# Patient Record
Sex: Male | Born: 1935 | Race: White | Hispanic: No | Marital: Married | Smoking: Former smoker
Health system: Southern US, Community
[De-identification: ages and names within clinical notes are randomized; demographics above are authoritative.]

## PROBLEM LIST (undated history)

## (undated) DIAGNOSIS — H7490 Unspecified disorder of middle ear and mastoid, unspecified ear: Secondary | ICD-10-CM

## (undated) DIAGNOSIS — R238 Other skin changes: Secondary | ICD-10-CM

## (undated) DIAGNOSIS — I2510 Atherosclerotic heart disease of native coronary artery without angina pectoris: Secondary | ICD-10-CM

## (undated) DIAGNOSIS — M1990 Unspecified osteoarthritis, unspecified site: Secondary | ICD-10-CM

## (undated) DIAGNOSIS — I10 Essential (primary) hypertension: Secondary | ICD-10-CM

## (undated) DIAGNOSIS — H409 Unspecified glaucoma: Secondary | ICD-10-CM

## (undated) DIAGNOSIS — R233 Spontaneous ecchymoses: Secondary | ICD-10-CM

## (undated) DIAGNOSIS — C801 Malignant (primary) neoplasm, unspecified: Secondary | ICD-10-CM

## (undated) DIAGNOSIS — R51 Headache: Secondary | ICD-10-CM

## (undated) DIAGNOSIS — R413 Other amnesia: Secondary | ICD-10-CM

## (undated) HISTORY — DX: Other skin changes: R23.8

## (undated) HISTORY — PX: CORONARY ARTERY BYPASS GRAFT: SHX141

## (undated) HISTORY — DX: Spontaneous ecchymoses: R23.3

## (undated) HISTORY — PX: CARDIAC CATHETERIZATION: SHX172

## (undated) HISTORY — PX: BACK SURGERY: SHX140

## (undated) HISTORY — PX: PACEMAKER INSERTION: SHX728

## (undated) HISTORY — PX: TONSILLECTOMY: SUR1361

## (undated) HISTORY — PX: OTHER SURGICAL HISTORY: SHX169

## (undated) HISTORY — DX: Other amnesia: R41.3

---

## 1965-04-28 HISTORY — PX: MIDDLE EAR SURGERY: SHX713

## 1994-04-28 DIAGNOSIS — C801 Malignant (primary) neoplasm, unspecified: Secondary | ICD-10-CM

## 1994-04-28 HISTORY — PX: TONGUE SURGERY: SHX810

## 1994-04-28 HISTORY — DX: Malignant (primary) neoplasm, unspecified: C80.1

## 2000-04-28 HISTORY — PX: JOINT REPLACEMENT: SHX530

## 2004-05-17 ENCOUNTER — Emergency Department: Payer: Self-pay | Admitting: Emergency Medicine

## 2005-07-24 ENCOUNTER — Ambulatory Visit: Payer: Self-pay | Admitting: Otolaryngology

## 2006-01-13 ENCOUNTER — Ambulatory Visit: Payer: Self-pay | Admitting: Anesthesiology

## 2006-02-02 ENCOUNTER — Ambulatory Visit: Payer: Self-pay | Admitting: Anesthesiology

## 2006-02-12 ENCOUNTER — Ambulatory Visit: Payer: Self-pay | Admitting: Gastroenterology

## 2006-03-03 ENCOUNTER — Ambulatory Visit: Payer: Self-pay | Admitting: Anesthesiology

## 2006-12-03 ENCOUNTER — Ambulatory Visit: Payer: Self-pay | Admitting: Orthopedic Surgery

## 2008-07-11 ENCOUNTER — Ambulatory Visit: Payer: Self-pay | Admitting: Unknown Physician Specialty

## 2009-06-12 ENCOUNTER — Ambulatory Visit: Payer: Self-pay | Admitting: Otolaryngology

## 2009-07-05 ENCOUNTER — Ambulatory Visit: Payer: Self-pay | Admitting: Cardiology

## 2009-08-01 ENCOUNTER — Ambulatory Visit: Payer: Self-pay | Admitting: Internal Medicine

## 2009-09-20 ENCOUNTER — Encounter: Payer: Self-pay | Admitting: Cardiology

## 2009-09-26 ENCOUNTER — Encounter: Payer: Self-pay | Admitting: Cardiology

## 2009-10-26 ENCOUNTER — Encounter: Payer: Self-pay | Admitting: Cardiology

## 2010-09-02 ENCOUNTER — Ambulatory Visit: Payer: Self-pay

## 2011-07-21 ENCOUNTER — Ambulatory Visit: Payer: Self-pay | Admitting: Neurosurgery

## 2011-10-22 ENCOUNTER — Other Ambulatory Visit: Payer: Self-pay | Admitting: Neurosurgery

## 2011-10-29 ENCOUNTER — Encounter (HOSPITAL_COMMUNITY): Payer: Self-pay | Admitting: Pharmacy Technician

## 2011-11-07 ENCOUNTER — Encounter (HOSPITAL_COMMUNITY): Payer: Self-pay

## 2011-11-07 ENCOUNTER — Encounter (HOSPITAL_COMMUNITY)
Admission: RE | Admit: 2011-11-07 | Discharge: 2011-11-07 | Disposition: A | Discharge status: Home / Self Care | Payer: Medicare Other | Source: Ambulatory Visit | Attending: Neurosurgery | Admitting: Neurosurgery

## 2011-11-07 HISTORY — DX: Malignant (primary) neoplasm, unspecified: C80.1

## 2011-11-07 HISTORY — DX: Atherosclerotic heart disease of native coronary artery without angina pectoris: I25.10

## 2011-11-07 HISTORY — DX: Unspecified disorder of middle ear and mastoid, unspecified ear: H74.90

## 2011-11-07 HISTORY — DX: Unspecified glaucoma: H40.9

## 2011-11-07 HISTORY — DX: Essential (primary) hypertension: I10

## 2011-11-07 HISTORY — DX: Headache: R51

## 2011-11-07 HISTORY — DX: Unspecified osteoarthritis, unspecified site: M19.90

## 2011-11-07 LAB — BASIC METABOLIC PANEL
BUN: 27 mg/dL — ABNORMAL HIGH (ref 6–23)
GFR calc non Af Amer: 68 mL/min — ABNORMAL LOW (ref >90)
Glucose, Bld: 155 mg/dL — ABNORMAL HIGH (ref 70–99)
Potassium: 4.1 mEq/L (ref 3.5–5.1)

## 2011-11-07 LAB — CBC
Hemoglobin: 13.2 g/dL (ref 13.0–17.0)
MCH: 30.6 pg (ref 26.0–34.0)
MCHC: 33.9 g/dL (ref 30.0–36.0)
MCV: 90 fL (ref 78.0–100.0)

## 2011-11-07 NOTE — Progress Notes (Signed)
req'd notes tests fro dr Lady Gary [facility]

## 2011-11-07 NOTE — Pre-Procedure Instructions (Addendum)
20 Raheim Beutler  11/07/2011   Your procedure is scheduled on:  11/12/11  Report to [facility] at 630 AM.  Call this number if you have problems the morning of surgery: [phone]   Remember:  Do not eat foodor drink:After Midnight.  .  Take these medicines the morning of surgery with A SIP OF WATER: , hydralazine,xalatan  STOP any aspirin ,nsaids, herbal meds,blood thinners   Do not wear jewelry, make-up or nail polish.  Do not wear lotions, powders, or perfumes. You may wear deodorant.  Do not shave 48 hours prior to surgery. Men may shave face and neck.  Do not bring valuables to the hospital.  Contacts, dentures or bridgework may not be worn into surgery.  Leave suitcase in the car. After surgery it may be brought to your room.  For patients admitted to the hospital, checkout time is 11:00 AM the day of discharge.   Patients discharged the day of surgery will not be allowed to drive home.  Name and phone number of your driver:laura [phone] wife  Special Instructions: CHG Shower Use Special Wash: 1/2 bottle night before surgery and 1/2 bottle morning of surgery.   Please read over the following fact sheets that you were given: Pain Booklet, Coughing and Deep Breathing and MRSA Information

## 2011-11-10 NOTE — Consult Note (Addendum)
Anesthesia Chart Review:  Patient is a 78 year old male posted for C2-3, C3-4, C4-5, C5-6, C6-7 posterior cervical fusion by Dr. Lovell Sheehan on 11/12/11. 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  BMI 31.    His Cardiologist is Dr. Lady Gary.  Patient says he saw Dr. Lady Gary earlier this year and was told he was okay for surgery.   Patient denied chest pain, and felt he was in his usual state of health. Records are still pending.    EKG on 11/07/11 showed SR with marked sinus arrhythmia, first degree AVB, non-specific intra-ventricular conduction block, lateral (I, aVL) T wave abnormalilty.   His pre-CABG cath from 07/05/09 [facility]) showed normal LV function, 90% proximal LAD, 40% mid LAD, 65% proximal Cx, 75% mid Cx, 80% OM1/OM2, 70% mid RCA.    Labs noted.  Glucose 155.  BUN/Cr 27/1.00, H/H 13.2/38.9, PLT 168.    CXR on 11/07/11 showed mild cardiomegaly. No active lung disease.   Of note, patient voiced concerns over consent wording.  To his understanding he was having what sounds like cervical foraminotomies but no cervical fusion.  He already has a call placed to Dr. Lovell Sheehan.  I also attached a note to his consent.  Patient reports he will ensure he is clear about the procedure before proceeding.  I'll follow-up records once received.  Shonna Chock, PA-C 11/10/11 1323  Addendum: 11/11/11 1200  Reviewed records from Dr. Lady Gary.  He was last seen on 07/03/11.  By notes, he was felt stable from a cardiac standpoint.  Notes did not mention his planned procedure. His EKG then showed SR, intra atrial and intra ventricular conduction delay, LAD, LAFB, lateral (I, aVL) ST/T wave abnormality, peaked T waves in anterior leads.  Dr. Lady Gary felt his EKG was stable.  No new diagnostic studies were ordered, and six month follow-up was  recommended.  His last stress test on on 05/18/09 (prior to his CABG).  There was no echo on file at [facility] or at [facility].  I requested records from [facility], but they would not send any information without a signed release from the patient.  (We will not be able to get this until he arrives on the day of surgery.)  I reviewed above with Anesthesiologist Dr. Noreene Larsson.  We will attempt to get [facility]'s records when patient arrives tomorrow, but if remains asymptomatic then anticipate he can proceed as planned.

## 2011-11-11 MED ORDER — CEFAZOLIN SODIUM-DEXTROSE 2-3 GM-% IV SOLR
2.0000 g | INTRAVENOUS | Status: AC
  Administered 2011-11-12: 2 g via INTRAVENOUS
  Filled 2011-11-11: qty 50

## 2011-11-12 ENCOUNTER — Inpatient Hospital Stay (HOSPITAL_COMMUNITY): Payer: Medicare Other

## 2011-11-12 ENCOUNTER — Encounter (HOSPITAL_COMMUNITY): Payer: Self-pay | Admitting: Anesthesiology

## 2011-11-12 ENCOUNTER — Inpatient Hospital Stay (HOSPITAL_COMMUNITY): Payer: Medicare Other | Admitting: Anesthesiology

## 2011-11-12 ENCOUNTER — Encounter (HOSPITAL_COMMUNITY): Payer: Self-pay | Admitting: *Deleted

## 2011-11-12 ENCOUNTER — Encounter (HOSPITAL_COMMUNITY)
Admission: RE | Disposition: A | Discharge status: Skilled Nursing Facility | Payer: Self-pay | Source: Ambulatory Visit | Attending: Neurosurgery

## 2011-11-12 ENCOUNTER — Inpatient Hospital Stay (HOSPITAL_COMMUNITY)
Admission: RE | Admit: 2011-11-12 | Discharge: 2011-11-15 | DRG: 473 | Disposition: A | Discharge status: Skilled Nursing Facility | Payer: Medicare Other | Source: Ambulatory Visit | Attending: Neurosurgery | Admitting: Neurosurgery

## 2011-11-12 DIAGNOSIS — Q762 Congenital spondylolisthesis: Secondary | ICD-10-CM

## 2011-11-12 DIAGNOSIS — E119 Type 2 diabetes mellitus without complications: Secondary | ICD-10-CM | POA: Diagnosis present

## 2011-11-12 DIAGNOSIS — I2510 Atherosclerotic heart disease of native coronary artery without angina pectoris: Secondary | ICD-10-CM | POA: Diagnosis present

## 2011-11-12 DIAGNOSIS — M4712 Other spondylosis with myelopathy, cervical region: Principal | ICD-10-CM

## 2011-11-12 DIAGNOSIS — M4802 Spinal stenosis, cervical region: Secondary | ICD-10-CM

## 2011-11-12 DIAGNOSIS — Z79899 Other long term (current) drug therapy: Secondary | ICD-10-CM

## 2011-11-12 DIAGNOSIS — I10 Essential (primary) hypertension: Secondary | ICD-10-CM | POA: Diagnosis present

## 2011-11-12 DIAGNOSIS — Z87891 Personal history of nicotine dependence: Secondary | ICD-10-CM

## 2011-11-12 DIAGNOSIS — Z96659 Presence of unspecified artificial knee joint: Secondary | ICD-10-CM

## 2011-11-12 DIAGNOSIS — H409 Unspecified glaucoma: Secondary | ICD-10-CM | POA: Diagnosis present

## 2011-11-12 DIAGNOSIS — Z96649 Presence of unspecified artificial hip joint: Secondary | ICD-10-CM

## 2011-11-12 DIAGNOSIS — Z01818 Encounter for other preprocedural examination: Secondary | ICD-10-CM

## 2011-11-12 DIAGNOSIS — Z951 Presence of aortocoronary bypass graft: Secondary | ICD-10-CM

## 2011-11-12 DIAGNOSIS — Z85810 Personal history of malignant neoplasm of tongue: Secondary | ICD-10-CM

## 2011-11-12 HISTORY — PX: POSTERIOR CERVICAL FUSION/FORAMINOTOMY: SHX5038

## 2011-11-12 LAB — GLUCOSE, CAPILLARY: Glucose-Capillary: 128 mg/dL — ABNORMAL HIGH (ref 70–99)

## 2011-11-12 SURGERY — POSTERIOR CERVICAL FUSION/FORAMINOTOMY LEVEL 5
Anesthesia: General | Site: Neck | Wound class: Clean

## 2011-11-12 MED ORDER — MENTHOL 3 MG MT LOZG
1.0000 | LOZENGE | OROMUCOSAL | Status: DC | PRN
  Filled 2011-11-12: qty 9

## 2011-11-12 MED ORDER — SIMVASTATIN 40 MG PO TABS
40.0000 mg | ORAL_TABLET | Freq: Every day | ORAL | Status: DC
  Administered 2011-11-12 – 2011-11-14 (×3): 40 mg via ORAL
  Filled 2011-11-12 (×5): qty 1

## 2011-11-12 MED ORDER — MEPERIDINE HCL 25 MG/ML IJ SOLN: 6.2500 mg | INTRAMUSCULAR | Status: DC | PRN

## 2011-11-12 MED ORDER — MORPHINE SULFATE 2 MG/ML IJ SOLN
1.0000 mg | INTRAMUSCULAR | Status: DC | PRN
Start: 2011-11-12 — End: 2011-11-15

## 2011-11-12 MED ORDER — DOCUSATE SODIUM 100 MG PO CAPS
100.0000 mg | ORAL_CAPSULE | Freq: Two times a day (BID) | ORAL | Status: DC
  Filled 2011-11-12: qty 1

## 2011-11-12 MED ORDER — ALBUMIN HUMAN 5 % IV SOLN
INTRAVENOUS | Status: DC | PRN
  Administered 2011-11-12: 14:00:00 via INTRAVENOUS

## 2011-11-12 MED ORDER — TIMOLOL MALEATE 0.5 % OP SOLN
1.0000 [drp] | Freq: Every day | OPHTHALMIC | Status: DC
  Administered 2011-11-12 – 2011-11-14 (×3): 1 [drp] via OPHTHALMIC
  Filled 2011-11-12 (×2): qty 5

## 2011-11-12 MED ORDER — CLONAZEPAM 0.5 MG PO TABS
0.5000 mg | ORAL_TABLET | Freq: Every evening | ORAL | Status: DC | PRN
  Administered 2011-11-13 – 2011-11-14 (×3): 0.5 mg via ORAL
  Filled 2011-11-12 (×3): qty 1

## 2011-11-12 MED ORDER — ONDANSETRON HCL 4 MG/2ML IJ SOLN
INTRAMUSCULAR | Status: DC | PRN
  Administered 2011-11-12: 4 mg via INTRAVENOUS

## 2011-11-12 MED ORDER — BUPIVACAINE LIPOSOME 1.3 % IJ SUSP
20.0000 mL | INTRAMUSCULAR | Status: AC
  Filled 2011-11-12: qty 20

## 2011-11-12 MED ORDER — HYDROCODONE-ACETAMINOPHEN 5-325 MG PO TABS
1.0000 | ORAL_TABLET | ORAL | Status: DC | PRN
  Administered 2011-11-14: 1 via ORAL
  Administered 2011-11-15: 2 via ORAL
  Filled 2011-11-12 (×2): qty 2

## 2011-11-12 MED ORDER — SODIUM CHLORIDE 0.9 % IV SOLN
INTRAVENOUS | Status: AC
  Filled 2011-11-12: qty 500

## 2011-11-12 MED ORDER — HYDRALAZINE HCL 50 MG PO TABS
50.0000 mg | ORAL_TABLET | Freq: Two times a day (BID) | ORAL | Status: DC
  Administered 2011-11-12 – 2011-11-15 (×6): 50 mg via ORAL
  Filled 2011-11-12 (×8): qty 1

## 2011-11-12 MED ORDER — PROPOFOL 10 MG/ML IV EMUL
INTRAVENOUS | Status: DC | PRN
  Administered 2011-11-12: 150 mg via INTRAVENOUS

## 2011-11-12 MED ORDER — BACITRACIN 50000 UNITS IM SOLR
INTRAMUSCULAR | Status: AC
  Filled 2011-11-12: qty 1

## 2011-11-12 MED ORDER — HYDROMORPHONE HCL PF 1 MG/ML IJ SOLN
INTRAMUSCULAR | Status: AC
  Filled 2011-11-12: qty 1

## 2011-11-12 MED ORDER — HYDROMORPHONE HCL PF 1 MG/ML IJ SOLN
0.2500 mg | INTRAMUSCULAR | Status: DC | PRN
  Administered 2011-11-12: 0.5 mg via INTRAVENOUS

## 2011-11-12 MED ORDER — CEFAZOLIN SODIUM-DEXTROSE 2-3 GM-% IV SOLR
2.0000 g | Freq: Three times a day (TID) | INTRAVENOUS | Status: AC
  Administered 2011-11-12 – 2011-11-13 (×2): 2 g via INTRAVENOUS
  Filled 2011-11-12 (×2): qty 50

## 2011-11-12 MED ORDER — THROMBIN 5000 UNITS EX KIT
PACK | CUTANEOUS | Status: DC | PRN
  Administered 2011-11-12 (×2): 5000 [IU] via TOPICAL

## 2011-11-12 MED ORDER — LIDOCAINE HCL (CARDIAC) 20 MG/ML IV SOLN
INTRAVENOUS | Status: DC | PRN
  Administered 2011-11-12: 80 mg via INTRAVENOUS

## 2011-11-12 MED ORDER — LACTATED RINGERS IV SOLN
INTRAVENOUS | Status: DC | PRN
  Administered 2011-11-12 (×2): via INTRAVENOUS

## 2011-11-12 MED ORDER — PHENYLEPHRINE HCL 10 MG/ML IJ SOLN
INTRAMUSCULAR | Status: DC | PRN
  Administered 2011-11-12 (×2): 40 ug via INTRAVENOUS

## 2011-11-12 MED ORDER — 0.9 % SODIUM CHLORIDE (POUR BTL) OPTIME
TOPICAL | Status: DC | PRN
  Administered 2011-11-12: 1000 mL

## 2011-11-12 MED ORDER — METFORMIN HCL 500 MG PO TABS
500.0000 mg | ORAL_TABLET | Freq: Two times a day (BID) | ORAL | Status: DC
  Administered 2011-11-13 – 2011-11-15 (×4): 500 mg via ORAL
  Filled 2011-11-12 (×8): qty 1

## 2011-11-12 MED ORDER — INSULIN ASPART 100 UNIT/ML ~~LOC~~ SOLN
0.0000 [IU] | SUBCUTANEOUS | Status: DC
  Administered 2011-11-12: 1 [IU] via SUBCUTANEOUS
  Administered 2011-11-13: 3 [IU] via SUBCUTANEOUS
  Administered 2011-11-13: 2 [IU] via SUBCUTANEOUS
  Administered 2011-11-13 – 2011-11-14 (×5): 3 [IU] via SUBCUTANEOUS
  Administered 2011-11-14 (×2): 2 [IU] via SUBCUTANEOUS

## 2011-11-12 MED ORDER — LACTATED RINGERS IV SOLN
INTRAVENOUS | Status: DC
  Administered 2011-11-12 – 2011-11-13 (×2): via INTRAVENOUS

## 2011-11-12 MED ORDER — BUPIVACAINE LIPOSOME 1.3 % IJ SUSP
INTRAMUSCULAR | Status: DC | PRN
  Administered 2011-11-12: 20 mL

## 2011-11-12 MED ORDER — ACETAMINOPHEN 650 MG RE SUPP: 650.0000 mg | RECTAL | Status: DC | PRN

## 2011-11-12 MED ORDER — MIDAZOLAM HCL 5 MG/5ML IJ SOLN
INTRAMUSCULAR | Status: DC | PRN
  Administered 2011-11-12: 2 mg via INTRAVENOUS

## 2011-11-12 MED ORDER — BACITRACIN ZINC 500 UNIT/GM EX OINT
TOPICAL_OINTMENT | CUTANEOUS | Status: DC | PRN
  Administered 2011-11-12: 1 via TOPICAL

## 2011-11-12 MED ORDER — HEMOSTATIC AGENTS (NO CHARGE) OPTIME
TOPICAL | Status: DC | PRN
  Administered 2011-11-12: 1 via TOPICAL

## 2011-11-12 MED ORDER — LATANOPROST 0.005 % OP SOLN
1.0000 [drp] | Freq: Every morning | OPHTHALMIC | Status: DC
  Administered 2011-11-13 – 2011-11-14 (×2): 1 [drp] via OPHTHALMIC
  Filled 2011-11-12: qty 2.5

## 2011-11-12 MED ORDER — DOCUSATE SODIUM 100 MG PO CAPS
100.0000 mg | ORAL_CAPSULE | Freq: Two times a day (BID) | ORAL | Status: DC
  Administered 2011-11-12 – 2011-11-15 (×6): 100 mg via ORAL
  Filled 2011-11-12 (×7): qty 1

## 2011-11-12 MED ORDER — WHITE PETROLATUM GEL
Status: AC
  Administered 2011-11-12: 0.2
  Filled 2011-11-12: qty 5

## 2011-11-12 MED ORDER — ONDANSETRON HCL 4 MG/2ML IJ SOLN: 4.0000 mg | INTRAMUSCULAR | Status: DC | PRN

## 2011-11-12 MED ORDER — HYDROCHLOROTHIAZIDE 25 MG PO TABS
25.0000 mg | ORAL_TABLET | Freq: Every day | ORAL | Status: DC
  Administered 2011-11-12 – 2011-11-15 (×4): 25 mg via ORAL
  Filled 2011-11-12 (×4): qty 1

## 2011-11-12 MED ORDER — ACETAMINOPHEN 325 MG PO TABS: 650.0000 mg | ORAL_TABLET | ORAL | Status: DC | PRN

## 2011-11-12 MED ORDER — ZOLPIDEM TARTRATE 5 MG PO TABS: 5.0000 mg | ORAL_TABLET | Freq: Every evening | ORAL | Status: DC | PRN

## 2011-11-12 MED ORDER — ROCURONIUM BROMIDE 100 MG/10ML IV SOLN
INTRAVENOUS | Status: DC | PRN
  Administered 2011-11-12: 20 mg via INTRAVENOUS
  Administered 2011-11-12: 10 mg via INTRAVENOUS
  Administered 2011-11-12: 50 mg via INTRAVENOUS
  Administered 2011-11-12: 10 mg via INTRAVENOUS

## 2011-11-12 MED ORDER — PHENOL 1.4 % MT LIQD: 1.0000 | OROMUCOSAL | Status: DC | PRN

## 2011-11-12 MED ORDER — FENTANYL CITRATE 0.05 MG/ML IJ SOLN
INTRAMUSCULAR | Status: DC | PRN
  Administered 2011-11-12: 100 ug via INTRAVENOUS
  Administered 2011-11-12 (×3): 50 ug via INTRAVENOUS

## 2011-11-12 MED ORDER — BUPIVACAINE-EPINEPHRINE PF 0.5-1:200000 % IJ SOLN
INTRAMUSCULAR | Status: DC | PRN
  Administered 2011-11-12: 30 mL

## 2011-11-12 MED ORDER — OXYCODONE-ACETAMINOPHEN 5-325 MG PO TABS
1.0000 | ORAL_TABLET | ORAL | Status: DC | PRN
  Administered 2011-11-13: 1 via ORAL
  Administered 2011-11-13 – 2011-11-14 (×3): 2 via ORAL
  Administered 2011-11-14: 1 via ORAL
  Administered 2011-11-14 – 2011-11-15 (×2): 2 via ORAL
  Filled 2011-11-12: qty 1
  Filled 2011-11-12 (×2): qty 2
  Filled 2011-11-12: qty 1
  Filled 2011-11-12 (×3): qty 2

## 2011-11-12 MED ORDER — PROMETHAZINE HCL 25 MG/ML IJ SOLN: 6.2500 mg | INTRAMUSCULAR | Status: DC | PRN

## 2011-11-12 MED ORDER — GLYCOPYRROLATE 0.2 MG/ML IJ SOLN
INTRAMUSCULAR | Status: DC | PRN
  Administered 2011-11-12: .8 mg via INTRAVENOUS

## 2011-11-12 MED ORDER — VECURONIUM BROMIDE 10 MG IV SOLR
INTRAVENOUS | Status: DC | PRN
  Administered 2011-11-12: 2 mg via INTRAVENOUS

## 2011-11-12 MED ORDER — NEOSTIGMINE METHYLSULFATE 1 MG/ML IJ SOLN
INTRAMUSCULAR | Status: DC | PRN
  Administered 2011-11-12: 5 mg via INTRAVENOUS

## 2011-11-12 MED ORDER — EPHEDRINE SULFATE 50 MG/ML IJ SOLN
INTRAMUSCULAR | Status: DC | PRN
  Administered 2011-11-12: 5 mg via INTRAVENOUS
  Administered 2011-11-12 (×2): 10 mg via INTRAVENOUS

## 2011-11-12 MED ORDER — SODIUM CHLORIDE 0.9 % IR SOLN
Status: DC | PRN
  Administered 2011-11-12: 12:00:00

## 2011-11-12 MED ORDER — DIAZEPAM 2 MG PO TABS: 2.0000 mg | ORAL_TABLET | Freq: Four times a day (QID) | ORAL | Status: DC | PRN

## 2011-11-12 SURGICAL SUPPLY — 76 items
BAG DECANTER FOR FLEXI CONT (MISCELLANEOUS) ×2 IMPLANT
BENZOIN TINCTURE PRP APPL 2/3 (GAUZE/BANDAGES/DRESSINGS) ×2 IMPLANT
BIT DRILL 2.4X (BIT) ×1 IMPLANT
BIT DRILL NEURO 2X3.1 SFT TUCH (MISCELLANEOUS) ×1 IMPLANT
BIT DRL 2.4X (BIT) ×1
BLADE SURG 11 STRL SS (BLADE) IMPLANT
BLADE SURG ROTATE 9660 (MISCELLANEOUS) ×2 IMPLANT
BLADE ULTRA TIP 2M (BLADE) IMPLANT
BRUSH SCRUB EZ 1% IODOPHOR (MISCELLANEOUS) IMPLANT
BRUSH SCRUB EZ PLAIN DRY (MISCELLANEOUS) ×2 IMPLANT
BUR ACORN 6.0 (BURR) ×2 IMPLANT
BUR MATCHSTICK NEURO 3.0 LAGG (BURR) ×2 IMPLANT
CANISTER SUCTION 2500CC (MISCELLANEOUS) ×2 IMPLANT
CLOTH BEACON ORANGE TIMEOUT ST (SAFETY) ×2 IMPLANT
CONT SPEC 4OZ CLIKSEAL STRL BL (MISCELLANEOUS) ×2 IMPLANT
DECANTER SPIKE VIAL GLASS SM (MISCELLANEOUS) ×2 IMPLANT
DRAIN JACKSON PRATT 10MM FLAT (MISCELLANEOUS) ×2 IMPLANT
DRAPE C-ARM 42X72 X-RAY (DRAPES) ×4 IMPLANT
DRAPE LAPAROTOMY 100X72 PEDS (DRAPES) ×2 IMPLANT
DRAPE MICROSCOPE LEICA (MISCELLANEOUS) IMPLANT
DRAPE POUCH INSTRU U-SHP 10X18 (DRAPES) ×2 IMPLANT
DRAPE SURG 17X23 STRL (DRAPES) ×4 IMPLANT
DRILL BIT (BIT) ×1
DRILL NEURO 2X3.1 SOFT TOUCH (MISCELLANEOUS) ×2
ELECT BLADE 4.0 EZ CLEAN MEGAD (MISCELLANEOUS) ×2
ELECT REM PT RETURN 9FT ADLT (ELECTROSURGICAL) ×2
ELECTRODE BLDE 4.0 EZ CLN MEGD (MISCELLANEOUS) ×1 IMPLANT
ELECTRODE REM PT RTRN 9FT ADLT (ELECTROSURGICAL) ×1 IMPLANT
EVACUATOR SILICONE 100CC (DRAIN) ×2 IMPLANT
GAUZE SPONGE 4X4 16PLY XRAY LF (GAUZE/BANDAGES/DRESSINGS) IMPLANT
GLOVE BIO SURGEON STRL SZ8.5 (GLOVE) ×6 IMPLANT
GLOVE ECLIPSE 7.5 STRL STRAW (GLOVE) ×6 IMPLANT
GLOVE EXAM NITRILE LRG STRL (GLOVE) IMPLANT
GLOVE EXAM NITRILE MD LF STRL (GLOVE) ×4 IMPLANT
GLOVE EXAM NITRILE XL STR (GLOVE) IMPLANT
GLOVE EXAM NITRILE XS STR PU (GLOVE) IMPLANT
GLOVE INDICATOR 7.0 STRL GRN (GLOVE) ×4 IMPLANT
GLOVE INDICATOR 8.0 STRL GRN (GLOVE) ×2 IMPLANT
GLOVE SS BIOGEL STRL SZ 8 (GLOVE) ×3 IMPLANT
GLOVE SUPERSENSE BIOGEL SZ 8 (GLOVE) ×3
GLOVE SURG SS PI 7.0 STRL IVOR (GLOVE) ×8 IMPLANT
GOWN BRE IMP SLV AUR LG STRL (GOWN DISPOSABLE) IMPLANT
GOWN BRE IMP SLV AUR XL STRL (GOWN DISPOSABLE) ×14 IMPLANT
GOWN STRL REIN 2XL LVL4 (GOWN DISPOSABLE) IMPLANT
KIT BASIN OR (CUSTOM PROCEDURE TRAY) ×2 IMPLANT
KIT INFUSE X SMALL 1.4CC (Orthopedic Implant) ×2 IMPLANT
KIT ROOM TURNOVER OR (KITS) ×2 IMPLANT
MARKER SKIN DUAL TIP RULER LAB (MISCELLANEOUS) ×2 IMPLANT
NEEDLE HYPO 22GX1.5 SAFETY (NEEDLE) ×2 IMPLANT
NEEDLE SPNL 18GX3.5 QUINCKE PK (NEEDLE) IMPLANT
NS IRRIG 1000ML POUR BTL (IV SOLUTION) ×2 IMPLANT
PACK LAMINECTOMY NEURO (CUSTOM PROCEDURE TRAY) ×2 IMPLANT
PAD ARMBOARD 7.5X6 YLW CONV (MISCELLANEOUS) ×6 IMPLANT
PATTIES SURGICAL .25X.25 (GAUZE/BANDAGES/DRESSINGS) IMPLANT
PIN MAYFIELD SKULL DISP (PIN) ×2 IMPLANT
ROD T 3.5MM (Rod) ×2 IMPLANT
RUBBERBAND STERILE (MISCELLANEOUS) IMPLANT
SCREW 4.0X22 (Screw) ×4 IMPLANT
SCREW CANCELLOUS 3.5X14MM (Screw) ×4 IMPLANT
SCREW SET M6 (Screw) ×8 IMPLANT
SPONGE GAUZE 4X4 12PLY (GAUZE/BANDAGES/DRESSINGS) ×2 IMPLANT
SPONGE LAP 4X18 X RAY DECT (DISPOSABLE) IMPLANT
SPONGE NEURO XRAY DETECT 1X3 (DISPOSABLE) IMPLANT
SPONGE SURGIFOAM ABS GEL SZ50 (HEMOSTASIS) ×2 IMPLANT
STAPLER SKIN PROX WIDE 3.9 (STAPLE) IMPLANT
STRIP CLOSURE SKIN 1/2X4 (GAUZE/BANDAGES/DRESSINGS) ×2 IMPLANT
SUT ETHILON 2 0 FS 18 (SUTURE) IMPLANT
SUT VIC AB 0 CT1 18XCR BRD8 (SUTURE) ×2 IMPLANT
SUT VIC AB 0 CT1 8-18 (SUTURE) ×2
SUT VIC AB 2-0 CP2 18 (SUTURE) ×4 IMPLANT
SYR 20ML ECCENTRIC (SYRINGE) ×2 IMPLANT
TAPE CLOTH SURG 4X10 WHT LF (GAUZE/BANDAGES/DRESSINGS) ×2 IMPLANT
TOWEL OR 17X24 6PK STRL BLUE (TOWEL DISPOSABLE) ×2 IMPLANT
TOWEL OR 17X26 10 PK STRL BLUE (TOWEL DISPOSABLE) ×2 IMPLANT
TRAY FOLEY CATH 14FRSI W/METER (CATHETERS) IMPLANT
WATER STERILE IRR 1000ML POUR (IV SOLUTION) ×2 IMPLANT

## 2011-11-12 NOTE — Anesthesia Preprocedure Evaluation (Addendum)
Anesthesia Evaluation  Patient identified by MRN, date of birth, ID band Patient awake    Reviewed: Allergy & Precautions, H&P , NPO status , Patient's Chart, lab work & pertinent test results  History of Anesthesia Complications Negative for: history of anesthetic complications  Airway Mallampati: II  Neck ROM: Limited   Comment: Front, very loose teeth, and I have  Discussed in detail c  Patient likelihood of loss . Dental  (+) Missing, Loose, Poor Dentition and Dental Advisory Given,    Pulmonary neg pulmonary ROS,  breath sounds clear to auscultation        Cardiovascular hypertension, + CAD and + CABG Rhythm:Regular Rate:Normal     Neuro/Psych  Headaches,    GI/Hepatic negative GI ROS, Neg liver ROS,   Endo/Other    Renal/GU negative Renal ROS     Musculoskeletal   Abdominal   Peds  Hematology   Anesthesia Other Findings   Reproductive/Obstetrics                          Anesthesia Physical Anesthesia Plan  ASA: III  Anesthesia Plan: General   Post-op Pain Management:    Induction: Intravenous  Airway Management Planned: Oral ETT  Additional Equipment:   Intra-op Plan:   Post-operative Plan: Extubation in OR  Informed Consent: I have reviewed the patients History and Physical, chart, labs and discussed the procedure including the risks, benefits and alternatives for the proposed anesthesia with the patient or authorized representative who has indicated his/her understanding and acceptance.   Dental advisory given  Plan Discussed with: CRNA and Surgeon  Anesthesia Plan Comments:         Anesthesia Quick Evaluation

## 2011-11-12 NOTE — H&P (Signed)
Subjective: The patient is a 78 year old white male who presents with a history of hand weakness numbness unsteady gait et Karie Soda. He was worked up with a cervical MRI which demonstrated significant stenosis at C2-3, C5-6 and C6-7 with a spondylolisthesis at C7-T1. We discussed the various treatment options including a decompressive cervical laminectomy with fusion at C7-T1. The patient has weighed the risks, benefits, and alternatives surgery decided proceed with the surgery. The patient also has a history of lumbar spinal stenosis.   Past Medical History  Diagnosis Date  . Coronary artery disease     cath ,cabg 11 at [facility]  . Hypertension   . Diabetes mellitus   . Middle ear disorder     problems surgery  . Arthritis   . Glaucoma   . Headache     hx of problems-abscess dx in back  . Cancer 89    tongue. partial removal     Past Surgical History  Procedure Date  . Cardiac catheterization   . Middle ear surgery 69    nerve damaged, osteomylitis hx, deaf rt  . Tongue surgery 89    partial removal ca  . Back surgery     lower abscess  . Tonsillectomy   . Coronary artery bypass graft     2011  . Joint replacement 02    rt knee02, lft ,hip revision 89hip 86  . Osteomylitis     surgeries arms, ear back    No Known Allergies  History  Substance Use Topics  . Smoking status: Former Smoker -- 1.0 packs/day for 46 years    Types: Cigarettes    Quit date: 11/07/1994  . Smokeless tobacco: Not on file   Comment: quit 69alcohol  . Alcohol Use: No    History reviewed. No pertinent family history. Prior to Admission medications   Medication Sig Start Date End Date Taking? Authorizing Provider  clonazePAM (KLONOPIN) 0.5 MG tablet Take 0.5 mg by mouth at bedtime as needed. For restless legs   Yes Historical Provider, MD  diphenhydramine-acetaminophen (TYLENOL PM) 25-500 MG TABS Take 1 tablet by mouth daily. Take every day per patient   Yes Historical Provider, MD  docusate sodium  (COLACE) 100 MG capsule Take 100 mg by mouth 2 (two) times daily. For soft stools   Yes Historical Provider, MD  hydrALAZINE (APRESOLINE) 50 MG tablet Take 50 mg by mouth 2 (two) times daily.   Yes Historical Provider, MD  hydrochlorothiazide (HYDRODIURIL) 25 MG tablet Take 25 mg by mouth daily.   Yes Historical Provider, MD  latanoprost (XALATAN) 0.005 % ophthalmic solution Place 1 drop into the right eye every morning. 1drop rt eye am   Yes Historical Provider, MD  metFORMIN (GLUCOPHAGE) 1000 MG tablet Take 500 mg by mouth 2 (two) times daily with a meal. 500 am, 1000 before main meal noon or pm   Yes Historical Provider, MD  simvastatin (ZOCOR) 40 MG tablet Take 40 mg by mouth at bedtime.   Yes Historical Provider, MD  timolol (TIMOPTIC) 0.5 % ophthalmic solution Place 1 drop into both eyes at bedtime. Bedtime each eye 1 drop   Yes Historical Provider, MD     Review of Systems  Positive ROS: As above  All other systems have been reviewed and were otherwise negative with the exception of those mentioned in the HPI and as above.  Objective: Vital signs in last 24 hours: Temp:  [97.8 F (36.6 C)] 97.8 F (36.6 C) (07/17 0826) Pulse Rate:  [73]  73  (07/17 0826) Resp:  [18] 18  (07/17 0826) BP: (137)/(74) 137/74 mmHg (07/17 0826) SpO2:  [97 %] 97 % (07/17 0826)  General Appearance: Alert, cooperative, no distress, appears stated age Head: Normocephalic, without obvious abnormality, atraumatic Eyes: PERRL, conjunctiva/corneas clear, EOM's intact, fundi benign, both eyes      Ears: Normal TM's and external ear canals, both ears Throat: Lips, mucosa, and tongue normal; teeth and gums normal Neck: Supple, symmetrical, trachea midline, no adenopathy; thyroid: No enlargement/tenderness/nodules; no carotid bruit or JVD Back: Symmetric, no curvature, ROM normal, no CVA tenderness Lungs: Clear to auscultation bilaterally, respirations unlabored Heart: Regular rate and rhythm, S1 and S2 normal,  no murmur, rub or gallop Abdomen: Soft, non-tender, bowel sounds active all four quadrants, no masses, no organomegaly Extremities: Extremities normal, atraumatic, no cyanosis or edema Pulses: 2+ and symmetric all extremities Skin: Skin color, texture, turgor normal, no rashes or lesions  NEUROLOGIC:   Mental status: alert and oriented, no aphasia, good attention span, Fund of knowledge/ memory ok Motor Exam - grossly normal the patient has a right facial palsy Sensory Exam - grossly normal Reflexes: Symmetric. Coordination - grossly normal the patient's hands are clumsy. Gait - grossly normal Balance - grossly normal Cranial Nerves: I: smell Not tested  II: visual acuity  OS: Normal    OD: Normal   II: visual fields Full to confrontation  II: pupils Equal, round, reactive to light  III,VII: ptosis None as above the right facial palsy   III,IV,VI: extraocular muscles  Full ROM  V: mastication Normal  V: facial light touch sensation  Normal  V,VII: corneal reflex  Present  VII: facial muscle function - upper  Normal  VII: facial muscle function - lower Normal  VIII: hearing Not tested  IX: soft palate elevation  Normal  IX,X: gag reflex Present  XI: trapezius strength  5/5  XI: sternocleidomastoid strength 5/5  XI: neck flexion strength  5/5  XII: tongue strength  Normal    Data Review Lab Results  Component Value Date   WBC 4.5 11/07/2011   HGB 13.2 11/07/2011   HCT 38.9* 11/07/2011   MCV 90.0 11/07/2011   PLT 168 11/07/2011   Lab Results  Component Value Date   NA 141 11/07/2011   K 4.1 11/07/2011   CL 103 11/07/2011   CO2 25 11/07/2011   BUN 27* 11/07/2011   CREATININE 1.00 11/07/2011   GLUCOSE 155* 11/07/2011   No results found for this basename: INR, PROTIME    Assessment/Plan: Cervical stenosis, cervical myelopathy, C7-T1 spondylolisthesis: I discussed situation with the patient and his wife. I reviewed the MR scan with them and pointed out the abnormalities. We  have discussed the various treatment options including a decompressive cervical laminectomy as well as the C7-T1 instrumentation and fusion. I described the surgery to them. I've shown him surgical models. We have discussed the risks, benefits, alternatives and likelihood of achieving our goals with surgery. I've answered all the patient's questions. He would like to proceed with the operation.   Alanson Hausmann D 11/12/2011 10:37 AM

## 2011-11-12 NOTE — Anesthesia Procedure Notes (Signed)
Procedure Name: Intubation Date/Time: 11/12/2011 11:35 AM Performed by: Edmonia Caprio Pre-anesthesia Checklist: Patient identified, Emergency Drugs available, Patient being monitored, Suction available and Timeout performed Patient Re-evaluated:Patient Re-evaluated prior to inductionOxygen Delivery Method: Circle system utilized Preoxygenation: Pre-oxygenation with 100% oxygen Intubation Type: IV induction Ventilation: Mask ventilation without difficulty Grade View: Grade I Tube type: Oral Airway Equipment and Method: Video-laryngoscopy Placement Confirmation: ETT inserted through vocal cords under direct vision,  breath sounds checked- equal and bilateral and positive ETCO2 Secured at: 22 cm Tube secured with: Tape Dental Injury: Teeth and Oropharynx as per pre-operative assessment

## 2011-11-12 NOTE — Transfer of Care (Signed)
Immediate Anesthesia Transfer of Care Note  Patient: Sean Estrada  Procedure(s) Performed: Procedure(s) (LRB): POSTERIOR CERVICAL FUSION/FORAMINOTOMY LEVEL 5 (N/A)  Patient Location: PACU  Anesthesia Type: General  Level of Consciousness: awake, alert  and oriented  Airway & Oxygen Therapy: Patient Spontanous Breathing and Patient connected to nasal cannula oxygen  Post-op Assessment: Report given to PACU RN and Post -op Vital signs reviewed and stable  Post vital signs: Reviewed and stable  Complications: No apparent anesthesia complications

## 2011-11-12 NOTE — Progress Notes (Signed)
Subjective:  The patient is somnolent but easily arousable. He looks well. He is in no apparent distress.  Objective: Vital signs in last 24 hours: Temp:  [97.8 F (36.6 C)] 97.8 F (36.6 C) (07/17 0826) Pulse Rate:  [73] 73  (07/17 0826) Resp:  [18] 18  (07/17 0826) BP: (137)/(74) 137/74 mmHg (07/17 0826) SpO2:  [97 %] 97 % (07/17 0826)  Intake/Output from previous day:   Intake/Output this shift: Total I/O In: 2850 [I.V.:2600; IV Piggyback:250] Out: 1300 [Urine:1000; Blood:300]  Physical exam the patient is somnolent but easily arousable. He is moving all 4 extremities well.  Lab Results: No results found for this basename: WBC:2,HGB:2,HCT:2,PLT:2 in the last 72 hours BMET No results found for this basename: NA:2,K:2,CL:2,CO2:2,GLUCOSE:2,BUN:2,CREATININE:2,CALCIUM:2 in the last 72 hours  Studies/Results: Dg Cervical Spine 1 View  11/12/2011  *RADIOLOGY REPORT*  Clinical Data: Cervical spinal stenosis.  DG CERVICAL SPINE - 1 VIEW  Comparison: None.  Findings: Cross-table portable lateral view of the cervical spine demonstrates instruments at the C4-5-6 levels posteriorly.  The upper clamp is at the inferior aspect of the spinous process of C3.  IMPRESSION: Superior clamp is at the spinous process of C3.  Original Report Authenticated By: Gwynn Burly, M.D.    Assessment/Plan: The patient is doing well.  LOS: 0 days     Laroy Mustard D 11/12/2011, 3:15 PM

## 2011-11-12 NOTE — Preoperative (Signed)
Beta Blockers   Reason not to administer Beta Blockers:Not Applicable 

## 2011-11-12 NOTE — Op Note (Signed)
Brief history: The patient is an 78 year old white male who has suffered from symptoms consistent with a cervical myelopathy. He was worked up with a cervical MRI which demonstrated significant cervical stenosis. I discussed the various treatment options with the patient including surgery. The patient has weighed the risks, benefits, and alternatives surgery decided proceed with a cervical decompression and fusion.  Preop diagnosis: Cervical myelopathy, cervical spinal stenosis, spondylolisthesis  Postop diagnosis same  Procedure: C2-C7 laminectomy and foraminotomy; C7-T1 posterior lateral arthrodesis with local morselized autograft bone and bone morphogenic protein, C7-T1 posterior" with [employer] titanium lateral mass and pedicle screws.  Surgeon: Dr. Delma Officer  Assistant: Dr. Shirlean Kelly  Anesthesia: Gen. endotracheal  Estimated blood loss: 125 cc  Specimens: None  Complications: None  Drains one 10 mm flat Jackson-Pratt drain  Description of procedure: The patient was brought to the operating room by the anesthesia team. General endotracheal anesthesia was induced. I applied the Mayfield 3 point headrest the patient's calvarium. The patient was carefully turned to the prone position on the chest rolls. His suboccipital region was then shaved with clippers and this area as well as his posterior cervical and posterior thoracic region was  prepared with Betadine scrub and Betadine solution. Sterile drapes were applied. I then injected the area to be incised with Marcaine with epinephrine solution. I uses scalpel to make a midline incision from approximate C2 down to T2. I used electrocautery to perform a bilateral subperiosteal dissection exposing the spinous process lamina from C2 down to T2. We obtained intraoperative radiograph to confirm our location.  We inserted the cerebellar and first track retractor for exposure. I used Leksell nodule were to remove the spinous process of C3,  C4, C5, C6 and C7. I used a high-speed drill to thin the lamina at C3, C4, C5, C6 and C7. I then completed the laminectomy at C3, C4, C5, C6, and C7 with a Kerrison punch. I removed the ligamentum flavum at C2-3, C3-4, C4-5, C5-6, C6-7, and C7-T1. Decompressing the spinal cord. I undercut the lamina at C3 with a Kerrison punch.  Having completed the decompression we now turned attention to the instrumentation. Under AP fluoroscopic guidance I drilled the bilateral T1 pedicles to 20 mm. Then removed the drill and probe inside the drill hole to rule out cortical breaches. There were none. I then placed a 4 x 20 mm Tish Frederickson screw into the T1 pedicle bilaterally again under fluoroscopic guidance. I then using standard trajectories laser lateral mass screw at C7 bilaterally. We got good bony purchase. I then cut a rod the appropriate length and connected the unilateral screws. We secured the rod in place with the caps which were tightened appropriately this completed the instrumentation.   we now turned attention to the posterior lateral arthrodesis. I used a high-speed drill to decorticate the remainder of the C7 and T1 lamina. We then laid a combination of bone morphogenic protein-soaked collagen sponges and local morselized autograph bone we obtained during the decompression over the decorticated C7-T1 lateral masses. This completed the arthrodesis at C7-T1.  We then obtained hemostasis using bipolar electrocautery. I placed a 10 f Jackson-Pratt drain in the epidural space and tunneled out through separate stab wound. We then removed the retractors and then reapproximated patient's cervical thoracic fascia with interrupted 0 Vicryl sutures. We then reapproximated the subcutaneous tissue with 2-0 Vicryl suture. We then reapproximated the skin with Steri-Strips and benzoin. The was then coated with bacitracin ointment. A sterile dressing was applied.  The drapes were removed. The patient was subsequently returned  to the supine position. I then removed the Mayfield 3 point headrest from his calvarium. The patient was subtotally extubated by the anesthesia team. He was transported to postanesthesia care unit in stable condition. All sponge instrument and needle counts were reportedly correct at as case.

## 2011-11-12 NOTE — Anesthesia Postprocedure Evaluation (Signed)
  Anesthesia Post-op Note  Patient: Sean Estrada  Procedure(s) Performed: Procedure(s) (LRB): POSTERIOR CERVICAL FUSION/FORAMINOTOMY LEVEL 5 (N/A)  Patient Location: PACU  Anesthesia Type: General  Level of Consciousness: awake  Airway and Oxygen Therapy: Patient Spontanous Breathing  Post-op Pain: mild  Post-op Assessment: Post-op Vital signs reviewed  Post-op Vital Signs: stable  Complications: No apparent anesthesia complications

## 2011-11-13 LAB — GLUCOSE, CAPILLARY
Glucose-Capillary: 163 mg/dL — ABNORMAL HIGH (ref 70–99)
Glucose-Capillary: 180 mg/dL — ABNORMAL HIGH (ref 70–99)

## 2011-11-13 NOTE — Clinical Social Work Psychosocial (Signed)
     Clinical Social Work Department BRIEF PSYCHOSOCIAL ASSESSMENT 11/13/2011  Patient:  Sean Estrada, Sean Estrada     Account Number:  [id number]     Admit date:  11/12/2011  Clinical Social Worker:  Jacelyn Grip  Date/Time:  11/13/2011 01:45 PM  Referred by:  Physician  Date Referred:  11/13/2011 Referred for  SNF Placement   Other Referral:   Interview type:  Patient Other interview type:    PSYCHOSOCIAL DATA Living Status:  FACILITY Admitted from facility:  [facility] Level of care:  Independent Living Primary support name:  Vernona Rieger WGNFA/[id number]/[phone] Primary support relationship to patient:  SPOUSE Degree of support available:   adequate    CURRENT CONCERNS Current Concerns  Post-Acute Placement   Other Concerns:    SOCIAL WORK ASSESSMENT / PLAN CSW received referral for new SNF placement. CSW met with pt at bedside to discuss. Pt discussed that he currently resides at [facility] with his spouse. Pt stated that he feels he will benefit from some rehab at the SNF at [facility] before returning to independent living level of care. Pt discussed that he is wants to do some rehab as "his wife is not extremely well" and may not be able to manage his care initially. Pt stated that he had discussed rehab with facility prior to admission and has completed rehab at the skilled level previously. CSW completed FL2 and initiated SNF search to [facility]. CSW contacted facility who confirmed that they can accept pt when pt is medically stable for discharge. CSW to continue to follow and facilitate pt discharge needs when pt medically stable for discharge.   Assessment/plan status:  Psychosocial Support/Ongoing Assessment of Needs Other assessment/ plan:   discharge planning   Information/referral to community resources:   Referral to [facility] SNF    PATIENTS/FAMILYS RESPONSE TO PLAN OF CARE: Pt alert and oriented, eating his  lunch at this time. Pt very engaged in conversation and appreciative of CSW support. Pt reports that he has been married to his wife for 60 years and they have been residing at [facility] since 1999. Pt motivated for rehab in order to return to the independent living level of care.

## 2011-11-13 NOTE — Progress Notes (Signed)
Patient ID: Sean Estrada, male   DOB: 1928-01-31, 78 y.o.   MRN: [id number] Subjective:  The patient is alert and oriented. He looks well. He has mild neck soreness. He feels his right hand is stronger already.  Objective: Vital signs in last 24 hours: Temp:  [97.2 F (36.2 C)-98.2 F (36.8 C)] 98.2 F (36.8 C) (07/18 0700) Pulse Rate:  [36-88] 71  (07/18 0900) Resp:  [12-21] 15  (07/18 0900) BP: (96-165)/(39-131) 112/63 mmHg (07/18 0922) SpO2:  [90 %-98 %] 95 % (07/18 0900) Weight:  [95.1 kg (209 lb 10.5 oz)] 95.1 kg (209 lb 10.5 oz) (07/17 2000)  Intake/Output from previous day: 07/17 0701 - 07/18 0700 In: 4520 [P.O.:720; I.V.:3500; IV Piggyback:300] Out: 2575 [Urine:2120; Drains:155; Blood:300] Intake/Output this shift: Total I/O In: 275 [P.O.:200; I.V.:75] Out: 700 [Urine:700]  Physical exam the patient is alert and oriented. He is moving all 4 extremities well. His right hand strength has improved. He remains weak in his left hand without change.  Lab Results: No results found for this basename: WBC:2,HGB:2,HCT:2,PLT:2 in the last 72 hours BMET No results found for this basename: NA:2,K:2,CL:2,CO2:2,GLUCOSE:2,BUN:2,CREATININE:2,CALCIUM:2 in the last 72 hours  Studies/Results: Dg Cervical Spine 1 View  11/12/2011  *RADIOLOGY REPORT*  Clinical Data: Neck pain  DG C-ARM 1-60 MIN,DG CERVICAL SPINE - 1 VIEW  Comparison: None.  Findings: C-arm films document screw placement in preparation for C7-T1 fusion.  IMPRESSION: As above.  Original Report Authenticated By: Elsie Stain, M.D.   Dg Cervical Spine 1 View  11/12/2011  *RADIOLOGY REPORT*  Clinical Data: Cervical spinal stenosis.  DG CERVICAL SPINE - 1 VIEW  Comparison: None.  Findings: Cross-table portable lateral view of the cervical spine demonstrates instruments at the C4-5-6 levels posteriorly.  The upper clamp is at the inferior aspect of the spinous process of C3.  IMPRESSION: Superior clamp is at the spinous process of  C3.  Original Report Authenticated By: Gwynn Burly, M.D.   Dg C-arm 1-60 Min  11/12/2011  *RADIOLOGY REPORT*  Clinical Data: Neck pain  DG C-ARM 1-60 MIN,DG CERVICAL SPINE - 1 VIEW  Comparison: None.  Findings: C-arm films document screw placement in preparation for C7-T1 fusion.  IMPRESSION: As above.  Original Report Authenticated By: Elsie Stain, M.D.    Assessment/Plan: Postop day 1: The patient is doing well. We will mobilize him with PT and OT. I will continue the Jackson-Pratt drain interval tomorrow. Patient will likely be transferred to the [facility] rehabilitation facility in a couple days.  LOS: 1 day     Sean Estrada D 11/13/2011, 10:14 AM

## 2011-11-13 NOTE — Progress Notes (Addendum)
Clinical Social Work Department CLINICAL SOCIAL WORK PLACEMENT NOTE 11/13/2011  Patient:  Sean Estrada, Sean Estrada  Account Number:  [id number] Admit date:  11/12/2011  Clinical Social Worker:  Jacelyn Grip  Date/time:  11/13/2011 02:00 PM  Clinical Social Work is seeking post-discharge placement for this patient at the following level of care:   SKILLED NURSING   (*CSW will update this form in Epic as items are completed)     Patient/family provided with [facility] Department of Clinical Social Work's list of facilities offering this level of care within the geographic area requested by the patient (or if unable, by the patient's family).  11/13/2011  Patient/family informed of their freedom to choose among providers that offer the needed level of care, that participate in Medicare, Medicaid or managed care program needed by the patient, have an available bed and are willing to accept the patient.    Patient/family informed of [facility]' ownership interest in [facility], as well as of the fact that they are under no obligation to receive care at this facility.  PASARR submitted to EDS on 11/13/2011 PASARR number received from EDS on 11/13/2011  FL2 transmitted to all facilities in geographic area requested by pt/family on  11/13/2011 FL2 transmitted to all facilities within larger geographic area on   Patient informed that his/her managed care company has contracts with or will negotiate with  certain facilities, including the following:     Patient/family informed of bed offers received:  11/13/2011 Patient chooses bed at [facility], The Physician recommends and patient chooses bed at  [facility]  Patient to be transferred to  on  11/15/11 (JB) Patient to be transferred to facility by FAMILY  The following physician request were entered in Epic:   Additional Comments: Pt from [facility] indpendent living and plan is for rehab at [facility].   Jacklynn Lewis, MSW, LCSWA  Clinical Social Work [phone]

## 2011-11-13 NOTE — Evaluation (Signed)
Physical Therapy Evaluation Patient Details Name: Sean Estrada MRN: [id number] DOB: [birth year] Today's Date: 11/13/2011 Time: 8413-2440 PT Time Calculation (min): 47 min  PT Assessment / Plan / Recommendation Clinical Impression  pt s/p multi level cervical lami and c7/T1 fusion.  Pain under control. Mobility improving, but pt still weak and gait mildly unsteady.  Recommend HHPT vs PT at SNF (at [facility]).  Pt leaning toward SNF due to little assist at home.    PT Assessment  Patient needs continued PT services    Follow Up Recommendations  Home health PT;Skilled nursing facility (depending on next 1-2 days of therapy)    Barriers to Discharge Decreased caregiver support (wife not extremely well)      Equipment Recommendations  None recommended by PT    Recommendations for Other Services     Frequency Min 6X/week    Precautions / Restrictions Precautions Precautions: Back;Fall Precaution Booklet Issued: No Required Braces or Orthoses: Cervical Brace Cervical Brace: Hard collar          Mobility  Bed Mobility Bed Mobility: Rolling Right;Right Sidelying to Sit;Sitting - Scoot to Edge of Bed Rolling Right: 4: Min guard;5: Set up;With rail Right Sidelying to Sit: 4: Min assist;HOB flat Sitting - Scoot to Edge of Bed: 4: Min guard Details for Bed Mobility Assistance: vc's for technique, safety, logroll reinforced; truncal asssit Transfers Transfers: Sit to Stand;Stand to Sit Sit to Stand: 4: Min assist;With upper extremity assist;From bed Stand to Sit: 4: Min assist;With upper extremity assist;To chair/3-in-1 Details for Transfer Assistance: vc's for hand placement; minor lifting/stability assist Ambulation/Gait Ambulation/Gait Assistance: 4: Min assist Ambulation Distance (Feet): 155 Feet Assistive device: Rolling walker Ambulation/Gait Assistance Details: generally steady with limp from leg length discrepancy.   vc's for safety issues; mild steady assist at  times. Gait Pattern: Step-through pattern;Decreased step length - right;Decreased step length - left;Decreased stride length Stairs: No Wheelchair Mobility Wheelchair Mobility: No    Exercises     PT Diagnosis: Generalized weakness;Acute pain  PT Problem List: Decreased strength;Decreased activity tolerance;Decreased balance;Decreased mobility;Decreased knowledge of use of DME;Decreased knowledge of precautions;Pain PT Treatment Interventions:     PT Goals Acute Rehab PT Goals PT Goal Formulation: With patient Time For Goal Achievement: 11/20/11 Potential to Achieve Goals: Good Pt will go Supine/Side to Sit: with supervision PT Goal: Supine/Side to Sit - Progress: Goal set today Pt will go Sit to Stand: with supervision PT Goal: Sit to Stand - Progress: Goal set today Pt will Transfer Bed to Chair/Chair to Bed: with supervision PT Transfer Goal: Bed to Chair/Chair to Bed - Progress: Goal set today Pt will Ambulate: >150 feet;with supervision;with rolling walker PT Goal: Ambulate - Progress: Goal set today  Visit Information  Last PT Received On: 11/13/11 Assistance Needed: +1    Subjective Data  Subjective: The only place I haven't had surgery is my feet Patient Stated Goal: Home eventually with my wife   Prior Functioning  Home Living Lives With: Spouse Available Help at Discharge:  (pt wishes to go to the nursing center at [facility] if neede) Type of Home: Apartment Home Layout: One level Bathroom Shower/Tub: Engineer, manufacturing systems: Standard Home Adaptive Equipment: Grab bars in shower;Shower chair without Retail banker - four wheeled (power scooter) Prior Function Level of Independence: Independent with assistive device(s) Driving: Yes Vocation: Retired Musician: No difficulties Dominant Hand: Left    Cognition  Overall Cognitive Status: Appears within functional limits for tasks assessed/performed Arousal/Alertness:  Awake/alert Orientation  Level: Oriented X4 / Intact Behavior During Session: [facility] for tasks performed    Extremity/Trunk Assessment Right Lower Extremity Assessment RLE ROM/Strength/Tone: Within functional levels RLE Sensation: WFL - Light Touch RLE Coordination: WFL - gross/fine motor Left Lower Extremity Assessment LLE ROM/Strength/Tone: WFL for tasks assessed;Deficits LLE ROM/Strength/Tone Deficits: generally weaker on Left in part due to past surgeries as a child Trunk Assessment Trunk Assessment: Normal (per pt weak and painful low back)   Balance Balance Balance Assessed: No  End of Session PT - End of Session Equipment Utilized During Treatment: Gait belt;Cervical collar Activity Tolerance: Patient tolerated treatment well Patient left: in chair;with call bell/phone within reach Nurse Communication: Mobility status  GP     Sean Estrada, Eliseo Gum 11/13/2011, 10:55 AM  11/13/2011  [place], PT [phone] (pager)

## 2011-11-14 ENCOUNTER — Encounter (HOSPITAL_COMMUNITY): Payer: Self-pay | Admitting: Neurosurgery

## 2011-11-14 LAB — GLUCOSE, CAPILLARY
Glucose-Capillary: 128 mg/dL — ABNORMAL HIGH (ref 70–99)
Glucose-Capillary: 131 mg/dL — ABNORMAL HIGH (ref 70–99)
Glucose-Capillary: 158 mg/dL — ABNORMAL HIGH (ref 70–99)
Glucose-Capillary: 175 mg/dL — ABNORMAL HIGH (ref 70–99)

## 2011-11-14 NOTE — Clinical Social Work Note (Signed)
CSW met with pt to inform him of discharge to [facility] on 11/15/2011. Pt is agreeable to discharge plan. Pt shared that he would like to have his daughter provide transportation to facility rather than [facility]. Weekend CSW will continue to follow and facilitate discharge over the weekend.   Dede Query, MSW, Theresia Majors [phone]

## 2011-11-14 NOTE — Discharge Summary (Signed)
  Physician Discharge Summary  Patient ID: Sean Estrada MRN: [id number] DOB/AGE: 11/13/1927 78 y.o.  Admit date: 11/12/2011 Discharge date: 11/14/2011  Admission Diagnoses: Cervical stenosis, spondylolisthesis, cervical myelopathy  Discharge Diagnoses: The same  Principal Problem:  *Cervical spondylosis with myelopathy Active Problems:  Cervical stenosis of spinal canal   Discharged Condition: good  Hospital Course: I admitted the patient to [facility] on 11/12/2011. On that day I performed a C2-C7 decompressive cervical laminectomy with instrumentation and fusion at C7-T1. The surgery went well.  The patient's postop course was unremarkable. He is making progress with PT and OT. He has requested rehabilitation at [facility]. These arrangements are being made and we're tentatively plan to send him there tomorrow.  Consults: None Significant Diagnostic Studies: None Treatments: C2-C7 decompressive laminectomy, C7-T1 instrumentation and fusion. Discharge Exam: Blood pressure 166/85, pulse 85, temperature 98.6 F (37 C), temperature source Oral, resp. rate 15, height 5\' 9"  (1.753 m), weight 95.1 kg (209 lb 10.5 oz), SpO2 96.00%. The patient is alert and oriented. He is moving all 4 extremities well. He continues to have some hand weakness. His dressing is clean and dry.  Disposition: [facility] rehabilitation.   Medication List  As of 11/14/2011  9:50 AM   ASK your doctor about these medications         clonazePAM 0.5 MG tablet   Commonly known as: KLONOPIN   Take 0.5 mg by mouth at bedtime as needed. For restless legs      COLACE 100 MG capsule   Generic drug: docusate sodium   Take 100 mg by mouth 2 (two) times daily. For soft stools      diphenhydramine-acetaminophen 25-500 MG Tabs   Commonly known as: TYLENOL PM   Take 1 tablet by mouth daily. Take every day per patient      hydrALAZINE 50 MG tablet   Commonly known as: APRESOLINE   Take 50 mg by mouth 2  (two) times daily.      hydrochlorothiazide 25 MG tablet   Commonly known as: HYDRODIURIL   Take 25 mg by mouth daily.      latanoprost 0.005 % ophthalmic solution   Commonly known as: XALATAN   Place 1 drop into the right eye every morning. 1drop rt eye am      metFORMIN 1000 MG tablet   Commonly known as: GLUCOPHAGE   Take 500 mg by mouth 2 (two) times daily with a meal. 500 am, 1000 before main meal noon or pm      simvastatin 40 MG tablet   Commonly known as: ZOCOR   Take 40 mg by mouth at bedtime.      timolol 0.5 % ophthalmic solution   Commonly known as: TIMOPTIC   Place 1 drop into both eyes at bedtime. Bedtime each eye 1 drop             Signed: Milissa Fesperman D 11/14/2011, 9:50 AM

## 2011-11-14 NOTE — Progress Notes (Signed)
RN notified of abnormal BP

## 2011-11-14 NOTE — Progress Notes (Signed)
Occupational Therapy Evaluation Patient Details Name: Sean Estrada MRN: [id number] DOB: [birth year] Today's Date: 11/14/2011 Time: 4098-1191 OT Time Calculation (min): 36 min  OT Assessment / Plan / Recommendation Clinical Impression  pt s/p multi level cervical lami and c7/T1 fusion thus affecting PLOF. Will benefit from acute OT services to address below problem list in prep for d/c to nursing center at [facility].    OT Assessment  Patient needs continued OT Services    Follow Up Recommendations  Skilled nursing facility    Barriers to Discharge      Equipment Recommendations  Defer to next venue    Recommendations for Other Services    Frequency  Min 2X/week    Precautions / Restrictions Precautions Precautions: Fall;Cervical Required Braces or Orthoses: Cervical Brace Cervical Brace: Hard collar   Pertinent Vitals/Pain See vitals    ADL  Eating/Feeding: Performed;Min guard (min guard to LUE) Where Assessed - Eating/Feeding: Edge of bed Lower Body Dressing: Performed;Minimal assistance Where Assessed - Lower Body Dressing: Unsupported sitting Toilet Transfer: Simulated;Minimal assistance Toilet Transfer Method: Sit to stand Toilet Transfer Equipment: Other (comment) (EOB) Equipment Used: Rolling walker (cervical collar) Transfers/Ambulation Related to ADLs: min assist with sit<>stand from bed ADL Comments: Pt with difficulty performing feeding and holding objects using LUE due to decreased ability to fully extend digits.     OT Diagnosis: Generalized weakness  OT Problem List: Decreased strength;Decreased activity tolerance;Decreased range of motion;Impaired balance (sitting and/or standing);Decreased knowledge of use of DME or AE;Decreased knowledge of precautions;Impaired UE functional use;Impaired sensation OT Treatment Interventions: Self-care/ADL training;Therapeutic exercise;DME and/or AE instruction;Therapeutic activities;Patient/family education;Balance  training   OT Goals Acute Rehab OT Goals OT Goal Formulation: With patient Time For Goal Achievement: 11/21/11 Potential to Achieve Goals: Good ADL Goals Pt Will Perform Eating: with modified independence;with adaptive utensils (using Right hand to hold cup 50% of time) ADL Goal: Eating - Progress: Goal set today Pt Will Transfer to Toilet: with modified independence;with DME;Ambulation;Comfort height toilet ADL Goal: Toilet Transfer - Progress: Goal set today Arm Goals Additional Arm Goal #1: Pt will independently perform fine motor tasks with right hand to maximize independence during ADLs. Arm Goal: Additional Goal #1 - Progress: Goal set today Additional Arm Goal #2: Pt will increase Left shoulder extension to 4/5 to maximize independence during ADLs. Arm Goal: Additional Goal #2 - Progress: Goal set today  Visit Information  Last OT Received On: 11/14/11 Assistance Needed: +1    Subjective Data      Prior Functioning  Vision/Perception  Home Living Lives With: Spouse Available Help at Discharge:  (pt wishes to go to nursing center at [facility] ) Type of Home: Apartment Home Layout: One level Bathroom Shower/Tub: Engineer, manufacturing systems: Standard Home Adaptive Equipment: Grab bars in shower;Shower chair without Retail banker - four wheeled Prior Function Level of Independence: Independent with assistive device(s) Driving: Yes Vocation: Retired Musician: No difficulties Dominant Hand: Left      Cognition  Overall Cognitive Status: Appears within functional limits for tasks assessed/performed Arousal/Alertness: Awake/alert Orientation Level: Oriented X4 / Intact Behavior During Session: WFL for tasks performed    Extremity/Trunk Assessment Right Upper Extremity Assessment RUE ROM/Strength/Tone: Deficits RUE ROM/Strength/Tone Deficits: Unable to fully extend digits; achieves ~1/2 AROM digit extension.  Overall 4/5  throughout shoulder and elbow. RUE Sensation: Deficits RUE Sensation Deficits: Reports numbness in finger tips. RUE Coordination: Deficits RUE Coordination Deficits: Unable to oppose thumb to digits. Left Upper Extremity Assessment LUE ROM/Strength/Tone: Deficits LUE  ROM/Strength/Tone Deficits: 3/5 for shoulder extension.  Overall 4/5 throughout elbow, grip, and shoulder flexion. LUE Sensation: Deficits LUE Sensation Deficits: Reports numbness in finger tips. LUE Coordination: Deficits LUE Coordination Deficits: Decreased gross motor when reaching for objects.   Mobility Bed Mobility Bed Mobility: Rolling Right;Right Sidelying to Sit;Sitting - Scoot to Edge of Bed;Sit to Sidelying Right Rolling Right: 4: Min guard;With rail Right Sidelying to Sit: 4: Min guard;With rails;HOB flat Sitting - Scoot to Edge of Bed: 5: Supervision Sit to Sidelying Right: 4: Min assist;HOB flat;With rail Details for Bed Mobility Assistance: Pt performed bed mobility x2 with VC for technique and safety. Transfers Transfers: Sit to Stand;Stand to Sit Sit to Stand: 4: Min assist;From bed;With upper extremity assist Stand to Sit: 4: Min guard;To bed;With upper extremity assist Details for Transfer Assistance: vc's for hand placement   Exercise General Exercises - Upper Extremity Shoulder Flexion: AROM;Left;20 reps;Seated Shoulder Extension: AROM;Left;20 reps;Seated  Balance Balance Balance Assessed: No  End of Session OT - End of Session Equipment Utilized During Treatment: Cervical collar Activity Tolerance: Patient tolerated treatment well Patient left: in bed;with call bell/phone within reach;with bed alarm set Nurse Communication: Mobility status  GO    11/14/2011 Cipriano Mile OTR/L Pager [phone] Office [phone]  Cipriano Mile 11/14/2011, 6:19 PM

## 2011-11-14 NOTE — Progress Notes (Signed)
Report given to Warden Fillers, RN on 4N. Patient and family aware of transfer. Patient awake alert denies any pain or needs. Transported via wheelchair with Nurse tech assist. No further needs pt stable in new room 4n-30C

## 2011-11-14 NOTE — Progress Notes (Signed)
Patient ID: Sean Estrada, male   DOB: 03/25/1928, 78 y.o.   MRN: [id number] Subjective:  The patient is alert and pleasant. He looks well.  Objective: Vital signs in last 24 hours: Temp:  [98.6 F (37 C)-99.3 F (37.4 C)] 98.6 F (37 C) (07/19 0700) Pulse Rate:  [69-98] 85  (07/19 0900) Resp:  [13-20] 15  (07/19 0900) BP: (91-166)/(33-85) 166/85 mmHg (07/19 0900) SpO2:  [87 %-98 %] 96 % (07/19 0900)  Intake/Output from previous day: 07/18 0701 - 07/19 0700 In: 1550 [P.O.:800; I.V.:750] Out: 2100 [Urine:1900; Drains:200] Intake/Output this shift: Total I/O In: 200 [P.O.:200] Out: 630 [Urine:600; Drains:30]  Physical exam patient is moving all 4 extremities well. His dressing is clean and dry. His drain has put out 30 cc. I discontinued it.  Lab Results: No results found for this basename: WBC:2,HGB:2,HCT:2,PLT:2 in the last 72 hours BMET No results found for this basename: NA:2,K:2,CL:2,CO2:2,GLUCOSE:2,BUN:2,CREATININE:2,CALCIUM:2 in the last 72 hours  Studies/Results: Dg Cervical Spine 1 View  11/12/2011  *RADIOLOGY REPORT*  Clinical Data: Neck pain  DG C-ARM 1-60 MIN,DG CERVICAL SPINE - 1 VIEW  Comparison: None.  Findings: C-arm films document screw placement in preparation for C7-T1 fusion.  IMPRESSION: As above.  Original Report Authenticated By: Elsie Stain, M.D.   Dg Cervical Spine 1 View  11/12/2011  *RADIOLOGY REPORT*  Clinical Data: Cervical spinal stenosis.  DG CERVICAL SPINE - 1 VIEW  Comparison: None.  Findings: Cross-table portable lateral view of the cervical spine demonstrates instruments at the C4-5-6 levels posteriorly.  The upper clamp is at the inferior aspect of the spinous process of C3.  IMPRESSION: Superior clamp is at the spinous process of C3.  Original Report Authenticated By: Gwynn Burly, M.D.   Dg C-arm 1-60 Min  11/12/2011  *RADIOLOGY REPORT*  Clinical Data: Neck pain  DG C-ARM 1-60 MIN,DG CERVICAL SPINE - 1 VIEW  Comparison: None.  Findings:  C-arm films document screw placement in preparation for C7-T1 fusion.  IMPRESSION: As above.  Original Report Authenticated By: Elsie Stain, M.D.    Assessment/Plan: Postop day #2: The patient seems to be doing well. He will likely go to [facility] rehabilitation tomorrow. I will transfer him to the floor today.  LOS: 2 days     Jguadalupe Opiela D 11/14/2011, 9:47 AM

## 2011-11-14 NOTE — Progress Notes (Signed)
Clinical Social Worker continuing to follow. Clinical Social Worker received notification from [facility] that facility needed discharge summary by 2 pm today if pt planned for discharge during weekend. Clinical Social Worker discussed with RN who notified MD and discharge summary provided. Clinical Social Worker sent discharge summary via TLC to facility. [facility] facility stated weekend Clinical Social Worker to fax remainder of discharge information to [phone] and for RN to call report to [phone]. Weekend Clinical Social Worker to facilitate pt discharge needs.  Jacklynn Lewis, MSW, LCSWA  Clinical Social Work [phone]

## 2011-11-15 LAB — GLUCOSE, CAPILLARY: Glucose-Capillary: 107 mg/dL — ABNORMAL HIGH (ref 70–99)

## 2011-11-15 MED ORDER — SORBITOL 70 % SOLN
30.0000 mL | Freq: Every day | Status: DC | PRN
  Administered 2011-11-15: 30 mL via ORAL
  Filled 2011-11-15: qty 30

## 2011-11-15 MED ORDER — DIAZEPAM 2 MG PO TABS: 2.0000 mg | ORAL_TABLET | Freq: Four times a day (QID) | ORAL | Status: AC | PRN

## 2011-11-15 MED ORDER — OXYCODONE-ACETAMINOPHEN 5-325 MG PO TABS: 1.0000 | ORAL_TABLET | ORAL | Status: AC | PRN

## 2011-11-15 NOTE — Progress Notes (Signed)
Pt to transfer to [facility] today via family transport.  RN to call report to [place], RN [phone] and provide pt's dtr with d/c packet. D/C packet complete with chart copy and signed FL2. CSW signing off as no other CSW needs identified. Dellie Burns, MSW, [place] [phone] (weekend)

## 2011-11-15 NOTE — Discharge Summary (Signed)
Physician Discharge Summary  Patient ID: Sean Estrada MRN: [id number] DOB/AGE: Apr 11, 1928 78 y.o.  Admit date: 11/12/2011 Discharge date: 11/15/2011  Admission Diagnoses: Cervical spinal stenosis with myelopathy Discharge Diagnoses: Same Principal Problem:  *Cervical spondylosis with myelopathy Active Problems:  Cervical stenosis of spinal canal   Discharged Condition: good  Hospital Course: I admitted the patient to [facility]  on 11/12/2011. On that day performed a C2-C7 decompressive laminectomy with instrumentation and fusion at C7-T1. The surgery went well.  The patient's postoperative course was unremarkable. We had PT and OT see the patient. He made good progress. Arrangements for were made for him to go to [facility] facility. He was stable and requesting discharge by 11/15/2011 and was transferred. The patient was given oral and written discharge instructions. All his questions were answered.  Consults: None Significant Diagnostic Studies: None Treatments: T2-C7 decompressive laminectomy with C7-T1 instrumentation and fusion. Discharge Exam: Blood pressure 128/71, pulse 81, temperature 98.7 F (37.1 C), temperature source Oral, resp. rate 16, height 5\' 9"  (1.753 m), weight 95.1 kg (209 lb 10.5 oz), SpO2 94.00%. Patient is alert and oriented. He is moving all 4 extremities well. He has some weaknesses in his hands gout change from his preoperative status.  Disposition: [facility] facility.  Discharge Orders    Future Orders Please Complete By Expires   Diet - low sodium heart healthy      Increase activity slowly      Discharge instructions      Comments:   Call [phone] for a followup appointment.   Remove dressing in 24 hours      Call MD for:  temperature >100.4      Call MD for:  persistant nausea and vomiting      Call MD for:  severe uncontrolled pain      Call MD for:  redness, tenderness, or signs of infection (pain,  swelling, redness, odor or green/yellow discharge around incision site)      Call MD for:  difficulty breathing, headache or visual disturbances      Call MD for:  hives      Call MD for:  persistant dizziness or light-headedness      Call MD for:  extreme fatigue        Medication List  As of 11/15/2011 10:38 AM   STOP taking these medications         diphenhydramine-acetaminophen 25-500 MG Tabs         TAKE these medications         clonazePAM 0.5 MG tablet   Commonly known as: KLONOPIN   Take 0.5 mg by mouth at bedtime as needed. For restless legs      COLACE 100 MG capsule   Generic drug: docusate sodium   Take 100 mg by mouth 2 (two) times daily. For soft stools      diazepam 2 MG tablet   Commonly known as: VALIUM   Take 1 tablet (2 mg total) by mouth every 6 (six) hours as needed.      hydrALAZINE 50 MG tablet   Commonly known as: APRESOLINE   Take 50 mg by mouth 2 (two) times daily.      hydrochlorothiazide 25 MG tablet   Commonly known as: HYDRODIURIL   Take 25 mg by mouth daily.      latanoprost 0.005 % ophthalmic solution   Commonly known as: XALATAN   Place 1 drop into the right eye every morning. 1drop rt  eye am      metFORMIN 1000 MG tablet   Commonly known as: GLUCOPHAGE   Take 500 mg by mouth 2 (two) times daily with a meal. 500 am, 1000 before main meal noon or pm      oxyCODONE-acetaminophen 5-325 MG per tablet   Commonly known as: PERCOCET/ROXICET   Take 1-2 tablets by mouth every 4 (four) hours as needed.      simvastatin 40 MG tablet   Commonly known as: ZOCOR   Take 40 mg by mouth at bedtime.      timolol 0.5 % ophthalmic solution   Commonly known as: TIMOPTIC   Place 1 drop into both eyes at bedtime. Bedtime each eye 1 drop             Signed: Aianna Fahs D 11/15/2011, 10:38 AM

## 2011-11-15 NOTE — Progress Notes (Signed)
Pt. Discharged to [facility] via private vehicle accompanied by daughter.  Escorted to exit via wheelchair by volunteer.  Discharge instructions and prescriptions given to daughter.  Report called to [place] at [facility].  Pt. Given laxative for constipation prior to discharge.  Mobility instructions given to patient with teach-back.  Pt. Given instructions on surgical site management with teach-back as well.  Appears to have good understanding of instructions.

## 2011-11-15 NOTE — Progress Notes (Signed)
Physical Therapy Treatment Patient Details Name: Sean Estrada MRN: [id number] DOB: 08-21-1927 Today's Date: 11/15/2011 Time: 4098-1191 PT Time Calculation (min): 21 min  PT Assessment / Plan / Recommendation Comments on Treatment Session  Patient motivated abnd progressing well. Plan to DC to [facility] today    Follow Up Recommendations  Skilled nursing facility    Barriers to Discharge        Equipment Recommendations  Defer to next venue    Recommendations for Other Services    Frequency     Plan Discharge plan remains appropriate;Frequency remains appropriate    Precautions / Restrictions Precautions Precautions: Fall;Cervical Required Braces or Orthoses: Cervical Brace Cervical Brace: Hard collar Restrictions Weight Bearing Restrictions: No   Pertinent Vitals/Pain     Mobility  Bed Mobility Right Sidelying to Sit: 6: Modified independent (Device/Increase time) Sitting - Scoot to Edge of Bed: 6: Modified independent (Device/Increase time) Sit to Sidelying Right: 6: Modified independent (Device/Increase time) Transfers Sit to Stand: 4: Min assist;With upper extremity assist;From bed Stand to Sit: 4: Min assist;With upper extremity assist;To bed Details for Transfer Assistance: Cues for safe technique. A for balance Ambulation/Gait Ambulation/Gait Assistance: 4: Min guard Ambulation Distance (Feet): 1000 Feet Assistive device: Rolling walker Ambulation/Gait Assistance Details: Cues for positioning inside of RW and posture    Exercises     PT Diagnosis:    PT Problem List:   PT Treatment Interventions:     PT Goals Acute Rehab PT Goals PT Goal: Supine/Side to Sit - Progress: Met PT Goal: Sit to Stand - Progress: Met PT Transfer Goal: Bed to Chair/Chair to Bed - Progress: Progressing toward goal PT Goal: Ambulate - Progress: Progressing toward goal  Visit Information  Last PT Received On: 11/15/11 Assistance Needed: +1    Subjective Data        Cognition  Overall Cognitive Status: Appears within functional limits for tasks assessed/performed Arousal/Alertness: Awake/alert Orientation Level: Appears intact for tasks assessed Behavior During Session: [facility] for tasks performed    Balance     End of Session PT - End of Session Equipment Utilized During Treatment: Gait belt;Cervical collar Activity Tolerance: Patient tolerated treatment well Patient left: in bed;with call bell/phone within reach Nurse Communication: Mobility status   GP     Fredrich Birks 11/15/2011, 10:12 AM 11/15/2011 Fredrich Birks PTA [phone] pager [phone] office

## 2011-11-15 NOTE — Progress Notes (Signed)
RN notified of high BP 

## 2011-11-19 DIAGNOSIS — E119 Type 2 diabetes mellitus without complications: Secondary | ICD-10-CM

## 2011-11-19 DIAGNOSIS — M4712 Other spondylosis with myelopathy, cervical region: Secondary | ICD-10-CM

## 2011-11-19 DIAGNOSIS — Q762 Congenital spondylolisthesis: Secondary | ICD-10-CM

## 2011-11-19 DIAGNOSIS — I10 Essential (primary) hypertension: Secondary | ICD-10-CM

## 2012-02-03 ENCOUNTER — Ambulatory Visit: Payer: Self-pay | Admitting: Neurosurgery

## 2012-03-01 ENCOUNTER — Ambulatory Visit: Payer: Self-pay | Admitting: Neurosurgery

## 2012-03-03 ENCOUNTER — Ambulatory Visit: Payer: Self-pay | Admitting: Gastroenterology

## 2013-03-14 ENCOUNTER — Encounter: Payer: Self-pay | Admitting: Podiatry

## 2013-03-18 ENCOUNTER — Ambulatory Visit: Payer: Self-pay | Admitting: Podiatry

## 2013-04-15 ENCOUNTER — Encounter: Payer: Self-pay | Admitting: Podiatry

## 2013-04-15 ENCOUNTER — Ambulatory Visit (INDEPENDENT_AMBULATORY_CARE_PROVIDER_SITE_OTHER): Payer: Medicare Other | Admitting: Podiatry

## 2013-04-15 VITALS — BP 106/65 | HR 40 | Resp 16 | Ht 70.0 in | Wt 210.0 lb

## 2013-04-15 DIAGNOSIS — B351 Tinea unguium: Secondary | ICD-10-CM

## 2013-04-15 DIAGNOSIS — M79609 Pain in unspecified limb: Secondary | ICD-10-CM

## 2013-04-15 NOTE — Progress Notes (Signed)
Subjective:     Patient ID: Sean Estrada, male   DOB: 1928/04/01, 79 y.o.   MRN: [id number]  HPI patient presents with thick nailbeds that are impossible to cut and painful 1-5 both feet  Review of Systems     Objective:   Physical Exam Mycotic nail infection noted 1-5 both feet with thick dystrophic nail bed that are painful when pressed    Assessment:     Mycotic nail infection with pain 1-5 both feet    Plan:     Debridement of painful nailbeds 1-5 both feet with no bleeding noted

## 2013-07-15 ENCOUNTER — Encounter: Payer: Self-pay | Admitting: Podiatry

## 2013-07-15 ENCOUNTER — Ambulatory Visit (INDEPENDENT_AMBULATORY_CARE_PROVIDER_SITE_OTHER): Payer: Medicare Other | Admitting: Podiatry

## 2013-07-15 VITALS — BP 176/95 | HR 65 | Resp 18

## 2013-07-15 DIAGNOSIS — M79609 Pain in unspecified limb: Secondary | ICD-10-CM

## 2013-07-15 DIAGNOSIS — B351 Tinea unguium: Secondary | ICD-10-CM

## 2013-07-15 NOTE — Progress Notes (Signed)
Trim painful toenails

## 2013-08-04 ENCOUNTER — Ambulatory Visit: Payer: Self-pay | Admitting: Cardiology

## 2013-08-04 LAB — BASIC METABOLIC PANEL
ANION GAP: 5 — AB (ref 7–16)
BUN: 24 mg/dL — AB (ref 7–18)
CHLORIDE: 112 mmol/L — AB (ref 98–107)
CREATININE: 1.17 mg/dL (ref 0.60–1.30)
Calcium, Total: 8.3 mg/dL — ABNORMAL LOW (ref 8.5–10.1)
Co2: 23 mmol/L (ref 21–32)
EGFR (African American): >60
GFR CALC NON AF AMER: 57 — AB
Glucose: 135 mg/dL — ABNORMAL HIGH (ref 65–99)
OSMOLALITY: 285 (ref 275–301)
POTASSIUM: 3.9 mmol/L (ref 3.5–5.1)
Sodium: 140 mmol/L (ref 136–145)

## 2013-08-04 LAB — CBC WITH DIFFERENTIAL/PLATELET
Basophil #: 0 10*3/uL (ref 0.0–0.1)
Basophil %: 1 %
EOS ABS: 0.1 10*3/uL (ref 0.0–0.7)
Eosinophil %: 2.7 %
HCT: 34.8 % — ABNORMAL LOW (ref 40.0–52.0)
HGB: 11.4 g/dL — ABNORMAL LOW (ref 13.0–18.0)
LYMPHS ABS: 0.7 10*3/uL — AB (ref 1.0–3.6)
LYMPHS PCT: 20.9 %
MCH: 29.5 pg (ref 26.0–34.0)
MCHC: 32.8 g/dL (ref 32.0–36.0)
MCV: 90 fL (ref 80–100)
Monocyte #: 0.3 x10 3/mm (ref 0.2–1.0)
Monocyte %: 9.1 %
NEUTROS ABS: 2.2 10*3/uL (ref 1.4–6.5)
Neutrophil %: 66.3 %
PLATELETS: 131 10*3/uL — AB (ref 150–440)
RBC: 3.87 10*6/uL — ABNORMAL LOW (ref 4.40–5.90)
RDW: 15.4 % — ABNORMAL HIGH (ref 11.5–14.5)
WBC: 3.3 10*3/uL — ABNORMAL LOW (ref 3.8–10.6)

## 2013-08-04 LAB — PROTIME-INR
INR: 1.4
PROTHROMBIN TIME: 17.1 s — AB (ref 11.5–14.7)

## 2013-08-04 LAB — APTT: Activated PTT: 33.2 secs (ref 23.6–35.9)

## 2013-08-11 ENCOUNTER — Ambulatory Visit: Payer: Self-pay | Admitting: Cardiology

## 2013-08-26 ENCOUNTER — Inpatient Hospital Stay (HOSPITAL_COMMUNITY)
Admission: EM | Admit: 2013-08-26 | Discharge: 2013-09-26 | DRG: 064 | Disposition: E | Payer: Medicare Other | Attending: Internal Medicine | Admitting: Internal Medicine

## 2013-08-26 ENCOUNTER — Emergency Department (HOSPITAL_COMMUNITY): Payer: Medicare Other

## 2013-08-26 ENCOUNTER — Encounter (HOSPITAL_COMMUNITY): Payer: Self-pay | Admitting: Emergency Medicine

## 2013-08-26 DIAGNOSIS — H409 Unspecified glaucoma: Secondary | ICD-10-CM | POA: Diagnosis present

## 2013-08-26 DIAGNOSIS — M4712 Other spondylosis with myelopathy, cervical region: Secondary | ICD-10-CM

## 2013-08-26 DIAGNOSIS — G936 Cerebral edema: Secondary | ICD-10-CM | POA: Diagnosis present

## 2013-08-26 DIAGNOSIS — Z87891 Personal history of nicotine dependence: Secondary | ICD-10-CM

## 2013-08-26 DIAGNOSIS — R0609 Other forms of dyspnea: Secondary | ICD-10-CM | POA: Diagnosis present

## 2013-08-26 DIAGNOSIS — I10 Essential (primary) hypertension: Secondary | ICD-10-CM

## 2013-08-26 DIAGNOSIS — I4891 Unspecified atrial fibrillation: Secondary | ICD-10-CM | POA: Diagnosis present

## 2013-08-26 DIAGNOSIS — Z85810 Personal history of malignant neoplasm of tongue: Secondary | ICD-10-CM

## 2013-08-26 DIAGNOSIS — M4802 Spinal stenosis, cervical region: Secondary | ICD-10-CM

## 2013-08-26 DIAGNOSIS — Z951 Presence of aortocoronary bypass graft: Secondary | ICD-10-CM

## 2013-08-26 DIAGNOSIS — I6350 Cerebral infarction due to unspecified occlusion or stenosis of unspecified cerebral artery: Principal | ICD-10-CM

## 2013-08-26 DIAGNOSIS — I639 Cerebral infarction, unspecified: Secondary | ICD-10-CM

## 2013-08-26 DIAGNOSIS — Z79899 Other long term (current) drug therapy: Secondary | ICD-10-CM

## 2013-08-26 DIAGNOSIS — R230 Cyanosis: Secondary | ICD-10-CM | POA: Diagnosis present

## 2013-08-26 DIAGNOSIS — R791 Abnormal coagulation profile: Secondary | ICD-10-CM | POA: Diagnosis present

## 2013-08-26 DIAGNOSIS — Z515 Encounter for palliative care: Secondary | ICD-10-CM

## 2013-08-26 DIAGNOSIS — Z66 Do not resuscitate: Secondary | ICD-10-CM | POA: Diagnosis present

## 2013-08-26 DIAGNOSIS — I2510 Atherosclerotic heart disease of native coronary artery without angina pectoris: Secondary | ICD-10-CM | POA: Diagnosis present

## 2013-08-26 DIAGNOSIS — R0989 Other specified symptoms and signs involving the circulatory and respiratory systems: Secondary | ICD-10-CM | POA: Diagnosis present

## 2013-08-26 DIAGNOSIS — E119 Type 2 diabetes mellitus without complications: Secondary | ICD-10-CM | POA: Diagnosis present

## 2013-08-26 DIAGNOSIS — R4182 Altered mental status, unspecified: Secondary | ICD-10-CM | POA: Diagnosis present

## 2013-08-26 DIAGNOSIS — Z950 Presence of cardiac pacemaker: Secondary | ICD-10-CM

## 2013-08-26 DIAGNOSIS — E860 Dehydration: Secondary | ICD-10-CM | POA: Diagnosis present

## 2013-08-26 DIAGNOSIS — Z96659 Presence of unspecified artificial knee joint: Secondary | ICD-10-CM

## 2013-08-26 DIAGNOSIS — N182 Chronic kidney disease, stage 2 (mild): Secondary | ICD-10-CM | POA: Diagnosis present

## 2013-08-26 DIAGNOSIS — I129 Hypertensive chronic kidney disease with stage 1 through stage 4 chronic kidney disease, or unspecified chronic kidney disease: Secondary | ICD-10-CM | POA: Diagnosis present

## 2013-08-26 LAB — URINALYSIS, ROUTINE W REFLEX MICROSCOPIC
GLUCOSE, UA: NEGATIVE mg/dL
Ketones, ur: 15 mg/dL — AB
LEUKOCYTES UA: NEGATIVE
Nitrite: NEGATIVE
PH: 5 (ref 5.0–8.0)
Protein, ur: >300 mg/dL — AB
SPECIFIC GRAVITY, URINE: 1.026 (ref 1.005–1.030)
Urobilinogen, UA: 0.2 mg/dL (ref 0.0–1.0)

## 2013-08-26 LAB — I-STAT CHEM 8, ED
BUN: 52 mg/dL — AB (ref 6–23)
Calcium, Ion: 1.12 mmol/L — ABNORMAL LOW (ref 1.13–1.30)
Chloride: 109 mEq/L (ref 96–112)
Creatinine, Ser: 1.3 mg/dL (ref 0.50–1.35)
Glucose, Bld: 150 mg/dL — ABNORMAL HIGH (ref 70–99)
HCT: 47 % (ref 39.0–52.0)
Hemoglobin: 16 g/dL (ref 13.0–17.0)
POTASSIUM: 4.5 meq/L (ref 3.7–5.3)
SODIUM: 147 meq/L (ref 137–147)
TCO2: 21 mmol/L (ref 0–100)

## 2013-08-26 LAB — COMPREHENSIVE METABOLIC PANEL
ALT: 339 U/L — AB (ref 0–53)
AST: 429 U/L — AB (ref 0–37)
Albumin: 3.8 g/dL (ref 3.5–5.2)
Alkaline Phosphatase: 81 U/L (ref 39–117)
BUN: 47 mg/dL — ABNORMAL HIGH (ref 6–23)
CALCIUM: 9.7 mg/dL (ref 8.4–10.5)
CO2: 19 meq/L (ref 19–32)
CREATININE: 1.1 mg/dL (ref 0.50–1.35)
Chloride: 107 mEq/L (ref 96–112)
GFR, EST AFRICAN AMERICAN: 70 mL/min — AB (ref >90)
GFR, EST NON AFRICAN AMERICAN: 60 mL/min — AB (ref >90)
Glucose, Bld: 149 mg/dL — ABNORMAL HIGH (ref 70–99)
Potassium: 4 mEq/L (ref 3.7–5.3)
SODIUM: 147 meq/L (ref 137–147)
Total Bilirubin: 1.2 mg/dL (ref 0.3–1.2)
Total Protein: 7.4 g/dL (ref 6.0–8.3)

## 2013-08-26 LAB — CBC WITH DIFFERENTIAL/PLATELET
Basophils Absolute: 0 10*3/uL (ref 0.0–0.1)
Basophils Relative: 0 % (ref 0–1)
Eosinophils Absolute: 0 10*3/uL (ref 0.0–0.7)
Eosinophils Relative: 0 % (ref 0–5)
HCT: 44.4 % (ref 39.0–52.0)
Hemoglobin: 14.3 g/dL (ref 13.0–17.0)
LYMPHS ABS: 0.7 10*3/uL (ref 0.7–4.0)
Lymphocytes Relative: 5 % — ABNORMAL LOW (ref 12–46)
MCH: 29.2 pg (ref 26.0–34.0)
MCHC: 32.2 g/dL (ref 30.0–36.0)
MCV: 90.6 fL (ref 78.0–100.0)
MONO ABS: 1 10*3/uL (ref 0.1–1.0)
Monocytes Relative: 7 % (ref 3–12)
NEUTROS ABS: 12.4 10*3/uL — AB (ref 1.7–7.7)
NEUTROS PCT: 88 % — AB (ref 43–77)
Platelets: 130 10*3/uL — ABNORMAL LOW (ref 150–400)
RBC: 4.9 MIL/uL (ref 4.22–5.81)
RDW: 15.1 % (ref 11.5–15.5)
WBC: 14.1 10*3/uL — ABNORMAL HIGH (ref 4.0–10.5)

## 2013-08-26 LAB — I-STAT ARTERIAL BLOOD GAS, ED
Acid-base deficit: 4 mmol/L — ABNORMAL HIGH (ref 0.0–2.0)
Bicarbonate: 20.1 mEq/L (ref 20.0–24.0)
O2 Saturation: 97 %
PCO2 ART: 31.5 mmHg — AB (ref 35.0–45.0)
TCO2: 21 mmol/L (ref 0–100)
pH, Arterial: 7.413 (ref 7.350–7.450)
pO2, Arterial: 84 mmHg (ref 80.0–100.0)

## 2013-08-26 LAB — RAPID URINE DRUG SCREEN, HOSP PERFORMED
Amphetamines: NOT DETECTED
Barbiturates: NOT DETECTED
Benzodiazepines: NOT DETECTED
COCAINE: NOT DETECTED
OPIATES: NOT DETECTED
Tetrahydrocannabinol: NOT DETECTED

## 2013-08-26 LAB — AMMONIA: Ammonia: 15 umol/L (ref 11–60)

## 2013-08-26 LAB — I-STAT TROPONIN, ED: Troponin i, poc: 1.53 ng/mL (ref 0.00–0.08)

## 2013-08-26 LAB — PROTIME-INR
INR: 1.77 — AB (ref 0.00–1.49)
Prothrombin Time: 20.1 seconds — ABNORMAL HIGH (ref 11.6–15.2)

## 2013-08-26 LAB — URINE MICROSCOPIC-ADD ON

## 2013-08-26 LAB — ETHANOL: Alcohol, Ethyl (B): <11 mg/dL (ref 0–11)

## 2013-08-26 LAB — I-STAT CG4 LACTIC ACID, ED: Lactic Acid, Venous: 4.24 mmol/L — ABNORMAL HIGH (ref 0.5–2.2)

## 2013-08-26 LAB — CK: Total CK: 4005 U/L — ABNORMAL HIGH (ref 7–232)

## 2013-08-26 MED ORDER — MORPHINE SULFATE 2 MG/ML IJ SOLN
2.0000 mg | INTRAMUSCULAR | Status: DC | PRN
  Administered 2013-08-27 – 2013-08-28 (×6): 2 mg via INTRAVENOUS
  Filled 2013-08-26 (×7): qty 1

## 2013-08-26 MED ORDER — VANCOMYCIN HCL IN DEXTROSE 750-5 MG/150ML-% IV SOLN
750.0000 mg | Freq: Two times a day (BID) | INTRAVENOUS | Status: DC
  Filled 2013-08-26: qty 150

## 2013-08-26 MED ORDER — SENNOSIDES-DOCUSATE SODIUM 8.6-50 MG PO TABS: 1.0000 | ORAL_TABLET | Freq: Every evening | ORAL | Status: DC | PRN

## 2013-08-26 MED ORDER — TIMOLOL MALEATE 0.5 % OP SOLN
1.0000 [drp] | Freq: Every day | OPHTHALMIC | Status: DC
  Administered 2013-08-27 (×2): 1 [drp] via OPHTHALMIC
  Filled 2013-08-26: qty 5

## 2013-08-26 MED ORDER — SIMVASTATIN 40 MG PO TABS
40.0000 mg | ORAL_TABLET | Freq: Every day | ORAL | Status: DC
  Filled 2013-08-26: qty 1

## 2013-08-26 MED ORDER — PIPERACILLIN-TAZOBACTAM 3.375 G IVPB
3.3750 g | Freq: Three times a day (TID) | INTRAVENOUS | Status: DC
  Filled 2013-08-26: qty 50

## 2013-08-26 MED ORDER — VANCOMYCIN HCL IN DEXTROSE 1-5 GM/200ML-% IV SOLN
1000.0000 mg | Freq: Once | INTRAVENOUS | Status: AC
  Administered 2013-08-26: 1000 mg via INTRAVENOUS
  Filled 2013-08-26: qty 200

## 2013-08-26 MED ORDER — METFORMIN HCL 500 MG PO TABS
500.0000 mg | ORAL_TABLET | Freq: Two times a day (BID) | ORAL | Status: DC
  Filled 2013-08-26 (×2): qty 1

## 2013-08-26 MED ORDER — HYDRALAZINE HCL 50 MG PO TABS
50.0000 mg | ORAL_TABLET | Freq: Two times a day (BID) | ORAL | Status: DC
  Filled 2013-08-26 (×2): qty 1

## 2013-08-26 MED ORDER — PIPERACILLIN-TAZOBACTAM 3.375 G IVPB 30 MIN
3.3750 g | Freq: Once | INTRAVENOUS | Status: AC
  Administered 2013-08-26: 3.375 g via INTRAVENOUS
  Filled 2013-08-26: qty 50

## 2013-08-26 MED ORDER — CLONAZEPAM 0.5 MG PO TABS: 0.5000 mg | ORAL_TABLET | Freq: Every day | ORAL | Status: DC

## 2013-08-26 MED ORDER — LATANOPROST 0.005 % OP SOLN
1.0000 [drp] | Freq: Every morning | OPHTHALMIC | Status: DC
  Administered 2013-08-27 – 2013-08-28 (×2): 1 [drp] via OPHTHALMIC
  Filled 2013-08-26: qty 2.5

## 2013-08-26 MED ORDER — SODIUM CHLORIDE 0.9 % IV SOLN
INTRAVENOUS | Status: DC
  Administered 2013-08-26 – 2013-08-28 (×3): via INTRAVENOUS

## 2013-08-26 MED ORDER — SODIUM CHLORIDE 0.9 % IV BOLUS (SEPSIS)
1000.0000 mL | Freq: Once | INTRAVENOUS | Status: AC
  Administered 2013-08-26: 1000 mL via INTRAVENOUS

## 2013-08-26 NOTE — ED Notes (Signed)
Pt up to floor with EMT and friend of daughter and family "Maudie Mercury",  (daughter is Manuela Schwartz).

## 2013-08-26 NOTE — ED Notes (Signed)
Blood drawn, Portable chest and xray being done

## 2013-08-26 NOTE — Progress Notes (Signed)
Unit CM UR Completed by [facility] ED CM  W. Liliauna Santoni RN  

## 2013-08-26 NOTE — Progress Notes (Signed)
Stroke swallow screen completed in the ED by bedside RN, patient failed the swallow evaluation and ST swallow ordered per protocol. Bedside RN on floor updated regarding care for patient. Will continue to monitor, advised bedside RN to call with further needs

## 2013-08-26 NOTE — ED Notes (Signed)
[facility] 2 days ago.  Pt has minimal responsiveness with sternal rub.  Pt has gaze and abnormal breathing.  Incontinent with strong smell of urine.  He was seen yesterday morning.  Right arm pressure sore area.  Pt is a DNR.  Pt has right side face head swelling.  GCS of 9 on arrival.  CBG   149

## 2013-08-26 NOTE — H&P (Signed)
[facility] History and Physical  Joshaua Epple GXQ:[id number] DOB: 1927/09/30 DOA: 09/06/2013  Referring physician: Doy Hutching, MD PCP: Adrian Prows, MD   Chief Complaint: Stroke  HPI: Andris Brothers is a 80 y.o. male with a history of Diabetes hypertension presents to the ED for a stroke. Patient was last seen by his family on Wednesday this week. His wife who is a resident of [facility] had noted that he had not visited her. After this friends tried to call him. Today he was found in his home down on the ground not responsive. The last time he was seen apparently he was in his normal state. Patient did have some forgetfulness as a baseline but his daughter states that he did not have dementia as such.   Review of Systems:  Unable to assess  Past Medical History  Diagnosis Date  . Coronary artery disease     cath ,cabg 11 at [facility]  . Hypertension   . Diabetes mellitus   . Middle ear disorder     problems surgery  . Arthritis   . Glaucoma   . Headache(784.0)     hx of problems-abscess dx in back  . 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 Bruises easily   . Memory loss    Past Surgical History  Procedure Laterality Date  . Cardiac catheterization    . Middle ear surgery  69    nerve damaged, osteomylitis hx, deaf rt  . Tongue surgery  96    partial removal ca  . Back surgery      lower abscess  . Tonsillectomy    . Coronary artery bypass graft      2011  . Joint replacement  02    rt knee02, lft ,hip revision 87hip 86  . Osteomylitis      surgeries arms, ear back  . Posterior cervical fusion/foraminotomy  11/12/2011    Procedure: POSTERIOR CERVICAL FUSION/FORAMINOTOMY LEVEL 5;  Surgeon: Ophelia Charter, MD;  Location: [place] [facility];  Service: Neurosurgery;  Laterality: N/A;  Cervical decompression two - seven with seven thoracic one posterior cervical fusion with instrumentation  . Pacemaker insertion     Social History:  reports that he quit smoking  about 18 years ago. His smoking use included Cigarettes. He has a 46 pack-year smoking history. He has never used smokeless tobacco. He reports that he does not drink alcohol or use illicit drugs.  No Known Allergies  No family history on file.   Prior to Admission medications   Medication Sig Start Date End Date Taking? Authorizing Provider  clonazePAM (KLONOPIN) 0.5 MG tablet Take 0.5 mg by mouth at bedtime as needed. For restless legs   Yes Historical Provider, MD  diclofenac (VOLTAREN) 75 MG EC tablet Take 75 mg by mouth 2 (two) times daily.   Yes Historical Provider, MD  diphenhydramine-acetaminophen (TYLENOL PM) 25-500 MG TABS Take 1 tablet by mouth at bedtime as needed (sleep).   Yes Historical Provider, MD  Docusate Calcium (STOOL SOFTENER PO) Take 1-2 tablets by mouth daily as needed (constipation).   Yes Historical Provider, MD  hydrALAZINE (APRESOLINE) 50 MG tablet Take 50 mg by mouth 2 (two) times daily.   Yes Historical Provider, MD  hydrochlorothiazide (HYDRODIURIL) 25 MG tablet Take 25 mg by mouth daily.   Yes Historical Provider, MD  latanoprost (XALATAN) 0.005 % ophthalmic solution Place 1 drop into the right eye every morning. 1drop rt eye am   Yes  Historical Provider, MD  metFORMIN (GLUCOPHAGE) 1000 MG tablet Take 500 mg by mouth 2 (two) times daily with a meal. 500 am, 1000 before main meal noon or pm   Yes Historical Provider, MD  simvastatin (ZOCOR) 40 MG tablet Take 40 mg by mouth at bedtime.   Yes Historical Provider, MD  timolol (TIMOPTIC) 0.5 % ophthalmic solution Place 1 drop into both eyes at bedtime. Bedtime each eye 1 drop   Yes Historical Provider, MD  traMADol (ULTRAM) 50 MG tablet Take 50-100 mg by mouth every 4 (four) hours as needed for moderate pain.   Yes Historical Provider, MD   Physical Exam: Filed Vitals:   09/04/2013 2155  BP: 123/59  Pulse:   Temp:   Resp: 18    BP 123/59  Pulse 32  Temp(Src) 98.8 F (37.1 C) (Rectal)  Resp 18  Ht 5' 10.08"  (1.78 m)  Wt 95.3 kg (210 lb 1.6 oz)  BMI 30.08 kg/m2  SpO2 100%  General:  comfortable Eyes: appears to follow to the left ENT: mouth clear Neck: no LAD, masses Cardiovascular: IRR, no gallop Telemetry: frequent PVCs  Respiratory: CTA bilaterally Abdomen: soft no organomegaly Skin: bruise or abrasion noted over right face around eyes Psychiatric: cannot assess Neurologic: nonverbal. Responds to deep pain otherwise unable to assess          Labs on Admission:  Basic Metabolic Panel:  Recent Labs Lab 09/10/2013 1650 09/06/2013 1706  NA 147 147  K 4.5 4.0  CL 109 107  CO2  --  19  GLUCOSE 150* 149*  BUN 52* 47*  CREATININE 1.30 1.10  CALCIUM  --  9.7   Liver Function Tests:  Recent Labs Lab 09/10/2013 1706  AST 429*  ALT 339*  ALKPHOS 81  BILITOT 1.2  PROT 7.4  ALBUMIN 3.8   No results found for this basename: LIPASE, AMYLASE,  in the last 168 hours  Recent Labs Lab 09/16/2013 1706  AMMONIA 15   CBC:  Recent Labs Lab 09/19/2013 1650 08/29/2013 1706  WBC  --  14.1*  NEUTROABS  --  12.4*  HGB 16.0 14.3  HCT 47.0 44.4  MCV  --  90.6  PLT  --  130*   Cardiac Enzymes:  Recent Labs Lab 09/23/2013 1706  CKTOTAL 4005*    BNP (last 3 results) No results found for this basename: PROBNP,  in the last 8760 hours CBG: No results found for this basename: GLUCAP,  in the last 168 hours  Radiological Exams on Admission: Ct Head Wo Contrast  09/01/2013   CLINICAL DATA:  Altered mental status. Found on the floor. Right facial swelling. History of tongue cancer.  EXAM: CT HEAD WITHOUT CONTRAST  CT MAXILLOFACIAL WITHOUT CONTRAST  CT CERVICAL SPINE WITHOUT CONTRAST  TECHNIQUE: Multidetector CT imaging of the head, cervical spine, and maxillofacial structures were performed using the standard protocol without intravenous contrast. Multiplanar CT image reconstructions of the cervical spine and maxillofacial structures were also generated.  COMPARISON:  Cervical spine CT  dated 03/01/2012.  FINDINGS: CT HEAD FINDINGS  Large area of low density involving the gray and white matter in left middle and posterior cerebral artery distributions. Associated areas of thickened cortex that area spared. There is also diffuse cortical thickening in the left frontal lobe with loss of the cortical sulci and compression of the left lateral ventricle. There is 9 mm of midline shift to the right. No evidence of transtentorial herniation. Small areas of hemorrhage within the area  of infarction in the left middle cerebral artery distribution. Right scalp soft tissue swelling. No fractures. Minimal fluid in the sphenoid sinus on the left. There is also fluid in the right maxillary sinus.  CT MAXILLOFACIAL FINDINGS  Small amount of fluid in the left sphenoid and left posterior ethmoid sinuses. Fluid in the right maxillary sinus with minimal mucosal thickening. Minimal left maxillary sinus mucosal thickening. No fractures. Right facial subcutaneous edema and swelling.  CT CERVICAL SPINE FINDINGS  Posterior laminectomy defects at multiple levels. Posterior fixation screws at the C7-T1 level with stable mild anterolisthesis at that level. The lower fixation screw on the right continues to cross the lateral aspect of the spinal canal at the T1 level. Mild reversal of the normal cervical lordosis. Extensive degenerative changes at multiple levels. Bilateral carotid artery calcifications. Prominent interstitial markings and bullous changes of both lung apices with pleural fluid on the right. Unremarkable thyroid gland.  IMPRESSION: 1. Large subacute infarction involving the left middle and posterior cerebral artery distributions with areas of cortical sparing with diffuse cortical thickening in that region. There is also diffuse cortical thickening in the compressed left frontal lobe and 9 mm of shift of midline structures from right to left. Further evaluation with pre and postcontrast magnetic resonance  imaging of the brain is recommended to exclude underlying malignancy or metastatic disease. 2. Small amount of hemorrhage within the subacute infarct in the left middle cerebral artery distribution. 3. Mild acute sphenoid, ethmoid and maxillary sinusitis. 4. Right facial and scalp soft tissue swelling without fracture. 5. Cervical spine postsurgical and degenerative changes without acute fracture or subluxation. 6. Bilateral carotid artery atheromatous calcifications. 7. Biapical bullous changes and interstitial pulmonary edema. 8. Right pleural effusion. These results were called by telephone at the time of interpretation on 09/07/2013 at 6:21 PM to Dr. Doy Hutching , who verbally acknowledged these results.   Electronically Signed   By: Enrique Sack M.D.   On: 09/21/2013 18:25   Ct Cervical Spine Wo Contrast  09/16/2013   CLINICAL DATA:  Altered mental status. Found on the floor. Right facial swelling. History of tongue cancer.  EXAM: CT HEAD WITHOUT CONTRAST  CT MAXILLOFACIAL WITHOUT CONTRAST  CT CERVICAL SPINE WITHOUT CONTRAST  TECHNIQUE: Multidetector CT imaging of the head, cervical spine, and maxillofacial structures were performed using the standard protocol without intravenous contrast. Multiplanar CT image reconstructions of the cervical spine and maxillofacial structures were also generated.  COMPARISON:  Cervical spine CT dated 03/01/2012.  FINDINGS: CT HEAD FINDINGS  Large area of low density involving the gray and white matter in left middle and posterior cerebral artery distributions. Associated areas of thickened cortex that area spared. There is also diffuse cortical thickening in the left frontal lobe with loss of the cortical sulci and compression of the left lateral ventricle. There is 9 mm of midline shift to the right. No evidence of transtentorial herniation. Small areas of hemorrhage within the area of infarction in the left middle cerebral artery distribution. Right scalp soft tissue  swelling. No fractures. Minimal fluid in the sphenoid sinus on the left. There is also fluid in the right maxillary sinus.  CT MAXILLOFACIAL FINDINGS  Small amount of fluid in the left sphenoid and left posterior ethmoid sinuses. Fluid in the right maxillary sinus with minimal mucosal thickening. Minimal left maxillary sinus mucosal thickening. No fractures. Right facial subcutaneous edema and swelling.  CT CERVICAL SPINE FINDINGS  Posterior laminectomy defects at multiple levels. Posterior fixation screws at the  C7-T1 level with stable mild anterolisthesis at that level. The lower fixation screw on the right continues to cross the lateral aspect of the spinal canal at the T1 level. Mild reversal of the normal cervical lordosis. Extensive degenerative changes at multiple levels. Bilateral carotid artery calcifications. Prominent interstitial markings and bullous changes of both lung apices with pleural fluid on the right. Unremarkable thyroid gland.  IMPRESSION: 1. Large subacute infarction involving the left middle and posterior cerebral artery distributions with areas of cortical sparing with diffuse cortical thickening in that region. There is also diffuse cortical thickening in the compressed left frontal lobe and 9 mm of shift of midline structures from right to left. Further evaluation with pre and postcontrast magnetic resonance imaging of the brain is recommended to exclude underlying malignancy or metastatic disease. 2. Small amount of hemorrhage within the subacute infarct in the left middle cerebral artery distribution. 3. Mild acute sphenoid, ethmoid and maxillary sinusitis. 4. Right facial and scalp soft tissue swelling without fracture. 5. Cervical spine postsurgical and degenerative changes without acute fracture or subluxation. 6. Bilateral carotid artery atheromatous calcifications. 7. Biapical bullous changes and interstitial pulmonary edema. 8. Right pleural effusion. These results were called by  telephone at the time of interpretation on 09/17/2013 at 6:21 PM to Dr. Doy Hutching , who verbally acknowledged these results.   Electronically Signed   By: Enrique Sack M.D.   On: 09/04/2013 18:25   Dg Pelvis Portable  08/27/2013   CLINICAL DATA:  Found down.  EXAM: PORTABLE PELVIS 1-2 VIEWS  COMPARISON:  None.  FINDINGS: There is a left hip prosthesis with multiple fractured cerclage wires near the greater trochanter. The right hip is intact. The pubic symphysis and SI joints are intact. No definite pelvic fractures.  IMPRESSION: No acute bony findings.   Electronically Signed   By: Kalman Jewels M.D.   On: 08/29/2013 17:42   Dg Chest Portable 1 View  09/22/2013   CLINICAL DATA:  Found down.  Altered mental status.  EXAM: PORTABLE CHEST - 1 VIEW  COMPARISON:  08/11/2013.  FINDINGS: The heart is enlarged but stable. Stable surgical changes from bypass surgery and stable right ventricular pacer wire. There is tortuosity, ectasia and calcification of the thoracic aorta. The lungs demonstrate mild vascular congestion. There is mild elevation right hemidiaphragm with overlying atelectasis and probable small effusion. No pneumothorax and no obvious rib fracture.  IMPRESSION: Stable cardiac enlargement and mild vascular congestion.  Mild elevation of the right hemidiaphragm with overlying vascular crowding and small effusion.   Electronically Signed   By: Kalman Jewels M.D.   On: 09/21/2013 17:30   Ct Maxillofacial Wo Cm  09/11/2013   CLINICAL DATA:  Altered mental status. Found on the floor. Right facial swelling. History of tongue cancer.  EXAM: CT HEAD WITHOUT CONTRAST  CT MAXILLOFACIAL WITHOUT CONTRAST  CT CERVICAL SPINE WITHOUT CONTRAST  TECHNIQUE: Multidetector CT imaging of the head, cervical spine, and maxillofacial structures were performed using the standard protocol without intravenous contrast. Multiplanar CT image reconstructions of the cervical spine and maxillofacial structures were also  generated.  COMPARISON:  Cervical spine CT dated 03/01/2012.  FINDINGS: CT HEAD FINDINGS  Large area of low density involving the gray and white matter in left middle and posterior cerebral artery distributions. Associated areas of thickened cortex that area spared. There is also diffuse cortical thickening in the left frontal lobe with loss of the cortical sulci and compression of the left lateral ventricle. There is 9  mm of midline shift to the right. No evidence of transtentorial herniation. Small areas of hemorrhage within the area of infarction in the left middle cerebral artery distribution. Right scalp soft tissue swelling. No fractures. Minimal fluid in the sphenoid sinus on the left. There is also fluid in the right maxillary sinus.  CT MAXILLOFACIAL FINDINGS  Small amount of fluid in the left sphenoid and left posterior ethmoid sinuses. Fluid in the right maxillary sinus with minimal mucosal thickening. Minimal left maxillary sinus mucosal thickening. No fractures. Right facial subcutaneous edema and swelling.  CT CERVICAL SPINE FINDINGS  Posterior laminectomy defects at multiple levels. Posterior fixation screws at the C7-T1 level with stable mild anterolisthesis at that level. The lower fixation screw on the right continues to cross the lateral aspect of the spinal canal at the T1 level. Mild reversal of the normal cervical lordosis. Extensive degenerative changes at multiple levels. Bilateral carotid artery calcifications. Prominent interstitial markings and bullous changes of both lung apices with pleural fluid on the right. Unremarkable thyroid gland.  IMPRESSION: 1. Large subacute infarction involving the left middle and posterior cerebral artery distributions with areas of cortical sparing with diffuse cortical thickening in that region. There is also diffuse cortical thickening in the compressed left frontal lobe and 9 mm of shift of midline structures from right to left. Further evaluation with  pre and postcontrast magnetic resonance imaging of the brain is recommended to exclude underlying malignancy or metastatic disease. 2. Small amount of hemorrhage within the subacute infarct in the left middle cerebral artery distribution. 3. Mild acute sphenoid, ethmoid and maxillary sinusitis. 4. Right facial and scalp soft tissue swelling without fracture. 5. Cervical spine postsurgical and degenerative changes without acute fracture or subluxation. 6. Bilateral carotid artery atheromatous calcifications. 7. Biapical bullous changes and interstitial pulmonary edema. 8. Right pleural effusion. These results were called by telephone at the time of interpretation on 09/01/2013 at 6:21 PM to Dr. Doy Hutching , who verbally acknowledged these results.   Electronically Signed   By: Enrique Sack M.D.   On: 09/03/2013 18:25    EKG: Independently reviewed. LBBB multiple PVCs atrial Fib  Assessment/Plan Active Problems:   CVA (cerebral infarction)   1. CVA -after extensive discussion with the daughter and family they do not want aggressive measures to be done at this time. The daughter who is an only child wants her father to be kept comfortable and agrees with getting a palliative care consultations. She does not want any other interventions to be done -She also states that she wants him to be DNR based on his wishes not to be resuscitated. -will start him on morphine for comfort and for pain -will get SLP and PT OT evaluation done however  2.  Elevated CPK -likely related to his CVA or from being down on ground -again he is comfort care no further intervention -will give fluids  3. Elevated glucose/Diabetes Mellitus -will monitor glucose and cover as appropriate -continue with his home medications  4. Hypertension -currently blood pressure is adequate -will hold his HCTZ as he is likely dehydrated and will require some fluids     Code Status: DNR (must indicate code status--if unknown or  must be presumed, indicate so) Family Communication: Daughter Manuela Schwartz in room (indicate person spoken with, if applicable, with phone number if by telephone) Disposition Plan: Palliative care probable Hospice care (indicate anticipated LOS)  Time spent: 64min  Anique Beckley A Lendora Keys [facility] Pager [phone]

## 2013-08-26 NOTE — ED Notes (Signed)
Returned back from xray. 

## 2013-08-26 NOTE — ED Notes (Signed)
Only daughter/ only child Sean Estrada) contact information: [phone] (cell: call first), [phone] (home), pt's cell with daughter [phone]. Daughter leaving at this time.

## 2013-08-26 NOTE — ED Notes (Signed)
Urine light tea colored/amber urine

## 2013-08-26 NOTE — Consult Note (Signed)
Neurology Consultation Reason for Consult: Stroke Referring Physician: Audie Pinto, R  CC: Stroke  History is obtained from:patient's family  HPI: Sean Estrada is a 80 y.o. male who was last seen normal on Wednesday who was found today down and unresponsive. He was found to have a likely large left MCA territory infarct. He has a history of  He has an EKG showing atrial fibrillation.    LKW: Wednesday, 4/29 tpa given?: no, outside of window    ROS: Unable to assess secondary to patient's altered mental status.    Past Medical History  Diagnosis Date  . Coronary artery disease     cath ,cabg 11 at [facility]  . Hypertension   . Diabetes mellitus   . Middle ear disorder     problems surgery  . Arthritis   . Glaucoma   . Headache(784.0)     hx of problems-abscess dx in back  . 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 Bruises easily   . Memory loss     Family History: Unable to assess secondary to patient's altered mental status.    Social History: Tob: Unable to assess secondary to patient's altered mental status.    Exam: Current vital signs: BP 147/74  Pulse 61  Temp(Src) 98.8 F (37.1 C) (Rectal)  Resp 19  SpO2 99% Vital signs in last 24 hours: Temp:  [98.8 F (37.1 C)] 98.8 F (37.1 C) (05/01 1630) Pulse Rate:  [44-71] 61 (05/01 1845) Resp:  [19-28] 19 (05/01 1845) BP: (147-182)/(74-97) 147/74 mmHg (05/01 1845) SpO2:  [98 %-100 %] 99 % (05/01 1845)  General: in bed, NAD CV: RRR Mental Status: Patient is lethargic, does not follow commands. Does not attempt to speak. He does open eyes  Cranial Nerves: II: Visual Fields are full. Pupils are equal, round, and reactive to light.  Discs are difficult to visualize. III,IV, VI: Eyes are dysconjugate with left eye outwardly deviated compared with right.  V: Facial sensation is difficult to assess.  VII: Facial movement is notable for mild right droop.  VIII, X, XI, XII: Unable to assess secondary to patient's  altered mental status.  Motor: Extends right arm to noxious stimuli, withdraws briskly in left arm and leg. Wtihdrawal vs flexion in right arm.  Sensory: Responds to nox stim x 4.  Deep Tendon Reflexes: 2+ and symmetric in the biceps and patellae.  Plantars: Toes are upgoing bilaterally Cerebellar: Unable to assess secondary to patient's altered mental status.  Gait: Unable to assess secondary to patient's altered mental status.          I have reviewed labs in epic and the results pertinent to this consultation are: Elevated INR, 1.77  I have reviewed the images obtained:CT head- Large left parietotemporal infarct with pethechial hemorrhage with some small areas of consolidation.   Impression: 80 yo M with subacute infarct. I think the chance of this being malignancy is low based on imaging and history. He has right arm motor involvement, and I suspect significant language deficits. His mental status on arrival and breathing pattern raised concern for airway protection, but the daughter states that he has indicated he would not want intubation in the past.   As far as care short of intubation(e.g. Central line for hyperosmolar therapy) the daughter is less certain what his wishes would be. I indicated that he would be unlikely to ever return to an independent state of living given that I suspected he was going to have problems  with speech and right sided motor movements. I indicated that this would be a potentially life saving intervention, but would be unlikely to return him to a state of independence.   If aggressive care is desired, then would place CVC for 3% and admit to the ICU. If family feels that comfort only care would be the patient's wishes then I think that this would be an appropriate course.   Recommendations: 1) Pending family decision regarding aggressiveness of care.    Roland Rack, MD [facility] [phone]  If 7pm- 7am, please page  neurology on call as listed in [place].

## 2013-08-26 NOTE — ED Notes (Signed)
No change in neuro assessment.  VSS.  Pt continues with the same abnormal puffed lip breathing with rate 20-22.Opens eyes to stimulation and moves LE with stimulation.  ED resident and charge nurse speaking with daughter at current time

## 2013-08-26 NOTE — ED Notes (Signed)
Pt remains non responsive and continues to have puffed lip breathing.  Plan to go to CT

## 2013-08-26 NOTE — ED Notes (Signed)
Pt remains unresponsive.  Pt is moving both arms.  Eyes to left but roll to right at times.  Pt is v-paced on monitor.  Portable xrays completed going to CT 1 now with rn and monitor

## 2013-08-26 NOTE — ED Notes (Signed)
Pt has one yellow watch and brown socks

## 2013-08-26 NOTE — ED Notes (Signed)
Elevated Troponin reported to Dr. Audie Pinto

## 2013-08-26 NOTE — ED Provider Notes (Signed)
CSN: [id number]     Arrival date & time 2013-09-16  1622 History   First MD Initiated Contact with Patient Sep 16, 2013 1623     Chief Complaint  Patient presents with  . Altered Mental Status     (Consider location/radiation/quality/duration/timing/severity/associated sxs/prior Treatment) HPI  Of note, the following history is obtained by paramedics, as well as chart review, as the patient is currently nonverbal:  This is a 80 y.o. male with PMH of CAD status post CABG in 2011, hypertension, diabetes, glaucoma, tongue cancer, pacemaker in place, presenting from nursing facility (patient lives in independent living area), unresponsive. Last seen normal was 3 days before this presentation. Patient supposedly calls his wife on the telephone on this day every week. When he did not, this raised suspicion with staff. They presented to his residence, found him unresponsive, on the ground, covered in urine. Paramedics were called. Upon arrival patient was unresponsive, hemodynamically stable, stable oxygen saturation. He was transported in stable condition. Unfortunately, I am unable to gather any additional history at this time.  Past Medical History  Diagnosis Date  . Coronary artery disease     cath ,cabg 11 at [facility]  . Hypertension   . Diabetes mellitus   . Middle ear disorder     problems surgery  . Arthritis   . Glaucoma   . Headache(784.0)     hx of problems-abscess dx in back  . 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 Bruises easily   . Memory loss    Past Surgical History  Procedure Laterality Date  . Cardiac catheterization    . Middle ear surgery  69    nerve damaged, osteomylitis hx, deaf rt  . Tongue surgery  89    partial removal ca  . Back surgery      lower abscess  . Tonsillectomy    . Coronary artery bypass graft      2011  . Joint replacement  02    rt knee02, lft ,hip revision 89hip 86  . Osteomylitis      surgeries arms, ear back  . Posterior cervical  fusion/foraminotomy  11/12/2011    Procedure: POSTERIOR CERVICAL FUSION/FORAMINOTOMY LEVEL 5;  Surgeon: Ophelia Charter, MD;  Location: [place] [facility];  Service: Neurosurgery;  Laterality: N/A;  Cervical decompression two - seven with seven thoracic one posterior cervical fusion with instrumentation   No family history on file. History  Substance Use Topics  . Smoking status: Former Smoker -- 1.00 packs/day for 46 years    Types: Cigarettes    Quit date: 11/07/1994  . Smokeless tobacco: Never Used     Comment: quit 69alcohol  . Alcohol Use: No    Review of Systems  Unable to perform ROS: Mental status change      Allergies  Review of patient's allergies indicates no known allergies.  Home Medications   Prior to Admission medications   Medication Sig Start Date End Date Taking? Authorizing Provider  clonazePAM (KLONOPIN) 0.5 MG tablet Take 0.5 mg by mouth at bedtime as needed. For restless legs    Historical Provider, MD  imiquimod (ALDARA) 5 % cream Apply topically daily.    Historical Provider, MD  latanoprost (XALATAN) 0.005 % ophthalmic solution Place 1 drop into the right eye every morning. 1drop rt eye am    Historical Provider, MD  metFORMIN (GLUCOPHAGE) 1000 MG tablet Take 500 mg by mouth 2 (two) times daily with a meal. 500 am, 1000 before  main meal noon or pm    Historical Provider, MD  simvastatin (ZOCOR) 40 MG tablet Take 40 mg by mouth at bedtime.    Historical Provider, MD  spironolactone (ALDACTONE) 25 MG tablet  06/20/13   Historical Provider, MD  timolol (TIMOPTIC) 0.5 % ophthalmic solution Place 1 drop into both eyes at bedtime. Bedtime each eye 1 drop    Historical Provider, MD   BP 170/79  Pulse 71  Temp(Src) 98.8 F (37.1 C) (Rectal)  Resp 28  SpO2 100% Physical Exam  Constitutional: He appears well-developed and well-nourished. No distress.  HENT:  Right Ear: External ear normal.  Left Ear: External ear normal.  Mouth/Throat: No oropharyngeal exudate.   Swelling, bruising to the right perirbital area  Negative for hemotympanum bilaterally  Eyes: Conjunctivae are normal. No scleral icterus.  Right pupil is normal size, but not reactive to light  Left pupil is round, reactive  Neck: No tracheal deviation present. No thyromegaly present.  Currently in cervical collar  Cardiovascular: Normal rate and normal heart sounds.  Exam reveals no gallop and no friction rub.   Irregular  Pulmonary/Chest: Effort normal and breath sounds normal. No stridor. No respiratory distress. He has no rales.  Coarse breath sounds bilaterally  Abdominal: Soft. He exhibits no distension and no mass.  Genitourinary: Penis normal.  Musculoskeletal: He exhibits no edema.  Neurological: GCS eye subscore is 4. GCS verbal subscore is 1. GCS motor subscore is 4.  Patient withdraws from pain in all 4 surety's  Skin: Skin is warm and dry. He is not diaphoretic.  Positive for abrasions to the right elbow, right hip, right lower leg    ED Course  Procedures (including critical care time) Labs Review Labs Reviewed  COMPREHENSIVE METABOLIC PANEL - Abnormal; Notable for the following:    Glucose, Bld 149 (*)    BUN 47 (*)    AST 429 (*)    ALT 339 (*)    GFR calc non Af Amer 60 (*)    GFR calc Af Amer 70 (*)    All other components within normal limits  CBC WITH DIFFERENTIAL - Abnormal; Notable for the following:    WBC 14.1 (*)    Platelets 130 (*)    Neutrophils Relative % 88 (*)    Neutro Abs 12.4 (*)    Lymphocytes Relative 5 (*)    All other components within normal limits  URINALYSIS, ROUTINE W REFLEX MICROSCOPIC - Abnormal; Notable for the following:    Color, Urine AMBER (*)    APPearance HAZY (*)    Hgb urine dipstick LARGE (*)    Bilirubin Urine SMALL (*)    Ketones, ur 15 (*)    Protein, ur >300 (*)    All other components within normal limits  CK - Abnormal; Notable for the following:    Total CK 4005 (*)    All other components within normal  limits  PROTIME-INR - Abnormal; Notable for the following:    Prothrombin Time 20.1 (*)    INR 1.77 (*)    All other components within normal limits  URINE MICROSCOPIC-ADD ON - Abnormal; Notable for the following:    Casts HYALINE CASTS (*)    All other components within normal limits  I-STAT CG4 LACTIC ACID, ED - Abnormal; Notable for the following:    Lactic Acid, Venous 4.24 (*)    All other components within normal limits  I-STAT CHEM 8, ED - Abnormal; Notable for the following:  BUN 52 (*)    Glucose, Bld 150 (*)    Calcium, Ion 1.12 (*)    All other components within normal limits  I-STAT TROPOININ, ED - Abnormal; Notable for the following:    Troponin i, poc 1.53 (*)    All other components within normal limits  I-STAT ARTERIAL BLOOD GAS, ED - Abnormal; Notable for the following:    pCO2 arterial 31.5 (*)    Acid-base deficit 4.0 (*)    All other components within normal limits  CULTURE, BLOOD (ROUTINE X 2)  CULTURE, BLOOD (ROUTINE X 2)  URINE CULTURE  URINE RAPID DRUG SCREEN (HOSP PERFORMED)  AMMONIA  ETHANOL    Imaging Review Ct Head Wo Contrast  09/01/2013   CLINICAL DATA:  Altered mental status. Found on the floor. Right facial swelling. History of tongue cancer.  EXAM: CT HEAD WITHOUT CONTRAST  CT MAXILLOFACIAL WITHOUT CONTRAST  CT CERVICAL SPINE WITHOUT CONTRAST  TECHNIQUE: Multidetector CT imaging of the head, cervical spine, and maxillofacial structures were performed using the standard protocol without intravenous contrast. Multiplanar CT image reconstructions of the cervical spine and maxillofacial structures were also generated.  COMPARISON:  Cervical spine CT dated 03/01/2012.  FINDINGS: CT HEAD FINDINGS  Large area of low density involving the gray and white matter in left middle and posterior cerebral artery distributions. Associated areas of thickened cortex that area spared. There is also diffuse cortical thickening in the left frontal lobe with loss of the  cortical sulci and compression of the left lateral ventricle. There is 9 mm of midline shift to the right. No evidence of transtentorial herniation. Small areas of hemorrhage within the area of infarction in the left middle cerebral artery distribution. Right scalp soft tissue swelling. No fractures. Minimal fluid in the sphenoid sinus on the left. There is also fluid in the right maxillary sinus.  CT MAXILLOFACIAL FINDINGS  Small amount of fluid in the left sphenoid and left posterior ethmoid sinuses. Fluid in the right maxillary sinus with minimal mucosal thickening. Minimal left maxillary sinus mucosal thickening. No fractures. Right facial subcutaneous edema and swelling.  CT CERVICAL SPINE FINDINGS  Posterior laminectomy defects at multiple levels. Posterior fixation screws at the C7-T1 level with stable mild anterolisthesis at that level. The lower fixation screw on the right continues to cross the lateral aspect of the spinal canal at the T1 level. Mild reversal of the normal cervical lordosis. Extensive degenerative changes at multiple levels. Bilateral carotid artery calcifications. Prominent interstitial markings and bullous changes of both lung apices with pleural fluid on the right. Unremarkable thyroid gland.  IMPRESSION: 1. Large subacute infarction involving the left middle and posterior cerebral artery distributions with areas of cortical sparing with diffuse cortical thickening in that region. There is also diffuse cortical thickening in the compressed left frontal lobe and 9 mm of shift of midline structures from right to left. Further evaluation with pre and postcontrast magnetic resonance imaging of the brain is recommended to exclude underlying malignancy or metastatic disease. 2. Small amount of hemorrhage within the subacute infarct in the left middle cerebral artery distribution. 3. Mild acute sphenoid, ethmoid and maxillary sinusitis. 4. Right facial and scalp soft tissue swelling without  fracture. 5. Cervical spine postsurgical and degenerative changes without acute fracture or subluxation. 6. Bilateral carotid artery atheromatous calcifications. 7. Biapical bullous changes and interstitial pulmonary edema. 8. Right pleural effusion. These results were called by telephone at the time of interpretation on 08/27/2013 at 6:21 PM to Dr. Maurie Boettcher  Italo Banton , who verbally acknowledged these results.   Electronically Signed   By: Enrique Sack M.D.   On: 09/15/2013 18:25   Ct Cervical Spine Wo Contrast  09/07/2013   CLINICAL DATA:  Altered mental status. Found on the floor. Right facial swelling. History of tongue cancer.  EXAM: CT HEAD WITHOUT CONTRAST  CT MAXILLOFACIAL WITHOUT CONTRAST  CT CERVICAL SPINE WITHOUT CONTRAST  TECHNIQUE: Multidetector CT imaging of the head, cervical spine, and maxillofacial structures were performed using the standard protocol without intravenous contrast. Multiplanar CT image reconstructions of the cervical spine and maxillofacial structures were also generated.  COMPARISON:  Cervical spine CT dated 03/01/2012.  FINDINGS: CT HEAD FINDINGS  Large area of low density involving the gray and white matter in left middle and posterior cerebral artery distributions. Associated areas of thickened cortex that area spared. There is also diffuse cortical thickening in the left frontal lobe with loss of the cortical sulci and compression of the left lateral ventricle. There is 9 mm of midline shift to the right. No evidence of transtentorial herniation. Small areas of hemorrhage within the area of infarction in the left middle cerebral artery distribution. Right scalp soft tissue swelling. No fractures. Minimal fluid in the sphenoid sinus on the left. There is also fluid in the right maxillary sinus.  CT MAXILLOFACIAL FINDINGS  Small amount of fluid in the left sphenoid and left posterior ethmoid sinuses. Fluid in the right maxillary sinus with minimal mucosal thickening. Minimal left  maxillary sinus mucosal thickening. No fractures. Right facial subcutaneous edema and swelling.  CT CERVICAL SPINE FINDINGS  Posterior laminectomy defects at multiple levels. Posterior fixation screws at the C7-T1 level with stable mild anterolisthesis at that level. The lower fixation screw on the right continues to cross the lateral aspect of the spinal canal at the T1 level. Mild reversal of the normal cervical lordosis. Extensive degenerative changes at multiple levels. Bilateral carotid artery calcifications. Prominent interstitial markings and bullous changes of both lung apices with pleural fluid on the right. Unremarkable thyroid gland.  IMPRESSION: 1. Large subacute infarction involving the left middle and posterior cerebral artery distributions with areas of cortical sparing with diffuse cortical thickening in that region. There is also diffuse cortical thickening in the compressed left frontal lobe and 9 mm of shift of midline structures from right to left. Further evaluation with pre and postcontrast magnetic resonance imaging of the brain is recommended to exclude underlying malignancy or metastatic disease. 2. Small amount of hemorrhage within the subacute infarct in the left middle cerebral artery distribution. 3. Mild acute sphenoid, ethmoid and maxillary sinusitis. 4. Right facial and scalp soft tissue swelling without fracture. 5. Cervical spine postsurgical and degenerative changes without acute fracture or subluxation. 6. Bilateral carotid artery atheromatous calcifications. 7. Biapical bullous changes and interstitial pulmonary edema. 8. Right pleural effusion. These results were called by telephone at the time of interpretation on 09/16/2013 at 6:21 PM to Dr. Doy Hutching , who verbally acknowledged these results.   Electronically Signed   By: Enrique Sack M.D.   On: 09/13/2013 18:25   Dg Pelvis Portable  09/08/2013   CLINICAL DATA:  Found down.  EXAM: PORTABLE PELVIS 1-2 VIEWS  COMPARISON:   None.  FINDINGS: There is a left hip prosthesis with multiple fractured cerclage wires near the greater trochanter. The right hip is intact. The pubic symphysis and SI joints are intact. No definite pelvic fractures.  IMPRESSION: No acute bony findings.   Electronically Signed  By: Kalman Jewels M.D.   On: 09/01/2013 17:42   Dg Chest Portable 1 View  09/11/2013   CLINICAL DATA:  Found down.  Altered mental status.  EXAM: PORTABLE CHEST - 1 VIEW  COMPARISON:  08/11/2013.  FINDINGS: The heart is enlarged but stable. Stable surgical changes from bypass surgery and stable right ventricular pacer wire. There is tortuosity, ectasia and calcification of the thoracic aorta. The lungs demonstrate mild vascular congestion. There is mild elevation right hemidiaphragm with overlying atelectasis and probable small effusion. No pneumothorax and no obvious rib fracture.  IMPRESSION: Stable cardiac enlargement and mild vascular congestion.  Mild elevation of the right hemidiaphragm with overlying vascular crowding and small effusion.   Electronically Signed   By: Kalman Jewels M.D.   On: 09/21/2013 17:30   Ct Maxillofacial Wo Cm  08/30/2013   CLINICAL DATA:  Altered mental status. Found on the floor. Right facial swelling. History of tongue cancer.  EXAM: CT HEAD WITHOUT CONTRAST  CT MAXILLOFACIAL WITHOUT CONTRAST  CT CERVICAL SPINE WITHOUT CONTRAST  TECHNIQUE: Multidetector CT imaging of the head, cervical spine, and maxillofacial structures were performed using the standard protocol without intravenous contrast. Multiplanar CT image reconstructions of the cervical spine and maxillofacial structures were also generated.  COMPARISON:  Cervical spine CT dated 03/01/2012.  FINDINGS: CT HEAD FINDINGS  Large area of low density involving the gray and white matter in left middle and posterior cerebral artery distributions. Associated areas of thickened cortex that area spared. There is also diffuse cortical thickening in the  left frontal lobe with loss of the cortical sulci and compression of the left lateral ventricle. There is 9 mm of midline shift to the right. No evidence of transtentorial herniation. Small areas of hemorrhage within the area of infarction in the left middle cerebral artery distribution. Right scalp soft tissue swelling. No fractures. Minimal fluid in the sphenoid sinus on the left. There is also fluid in the right maxillary sinus.  CT MAXILLOFACIAL FINDINGS  Small amount of fluid in the left sphenoid and left posterior ethmoid sinuses. Fluid in the right maxillary sinus with minimal mucosal thickening. Minimal left maxillary sinus mucosal thickening. No fractures. Right facial subcutaneous edema and swelling.  CT CERVICAL SPINE FINDINGS  Posterior laminectomy defects at multiple levels. Posterior fixation screws at the C7-T1 level with stable mild anterolisthesis at that level. The lower fixation screw on the right continues to cross the lateral aspect of the spinal canal at the T1 level. Mild reversal of the normal cervical lordosis. Extensive degenerative changes at multiple levels. Bilateral carotid artery calcifications. Prominent interstitial markings and bullous changes of both lung apices with pleural fluid on the right. Unremarkable thyroid gland.  IMPRESSION: 1. Large subacute infarction involving the left middle and posterior cerebral artery distributions with areas of cortical sparing with diffuse cortical thickening in that region. There is also diffuse cortical thickening in the compressed left frontal lobe and 9 mm of shift of midline structures from right to left. Further evaluation with pre and postcontrast magnetic resonance imaging of the brain is recommended to exclude underlying malignancy or metastatic disease. 2. Small amount of hemorrhage within the subacute infarct in the left middle cerebral artery distribution. 3. Mild acute sphenoid, ethmoid and maxillary sinusitis. 4. Right facial and  scalp soft tissue swelling without fracture. 5. Cervical spine postsurgical and degenerative changes without acute fracture or subluxation. 6. Bilateral carotid artery atheromatous calcifications. 7. Biapical bullous changes and interstitial pulmonary edema. 8. Right pleural effusion.  These results were called by telephone at the time of interpretation on 08/27/2013 at 6:21 PM to Dr. Doy Hutching , who verbally acknowledged these results.   Electronically Signed   By: Enrique Sack M.D.   On: 09/11/2013 18:25     EKG Interpretation None      MDM   Final diagnoses:  None    Of note, the following history is obtained by paramedics, as well as chart review, as the patient is currently nonverbal:  This is a 80 y.o. male with PMH of CAD status post CABG in 2011, hypertension, diabetes, glaucoma, tongue cancer, pacemaker in place, presenting from nursing facility (patient lives in independent living area), unresponsive. Last seen normal was 3 days before this presentation. Patient supposedly calls his wife on the telephone on this day every week. When he did not, this raised suspicion with staff. They presented to his residence, found him unresponsive, on the ground, covered in urine. Paramedics were called. Upon arrival patient was unresponsive, hemodynamically stable, stable oxygen saturation. He was transported in stable condition. Unfortunately, I am unable to gather any additional history at this time.  Exam as above, with evidence of trauma to the right periorbital area, abrasions on the right side of the body, GCS of 9. Patient is protecting his airway. Breath sounds are equal, but coarse bilaterally. His blood pressure is stable and we have one point peripheral access. His GCS is 9. Patient has been properly exposed with findings as above.  At this time, differential diagnosis is broad considering this elderly gentleman with multiple comorbidities, found down after possibly 3 days. It includes  but is not limited to infection, acidosis, stroke, intracranial hemorrhage, mass, dissection, aneurysm, polypharmacy, withdrawal, intoxication, poisoning, bleed, metabolic abnormality, cardiac etiology. CT head, face, C-spine, plain films chest, pelvis, laboratory tests, EKG have been ordered. He is stable at this time. We'll continue to monitor closely, followup results.  Will fluid resuscitate, as patient is likely been down for a long period of time the   Date: 09/11/2013  Rate: 85  Rhythm: atrial fibrillation and premature ventricular contractions (PVC)  QRS Axis: left  Intervals: QRS prolonged  ST/T Wave abnormalities: nonspecific ST changes  Conduction Disutrbances:left bundle branch block  Narrative Interpretation: A fib, PVCs, LBBB  Old EKG Reviewed: changes noted (new LBBB)   Patient has increased lactate of 4.24. I-STAT chem 8 reveals mild uremia (BUN of 52).    As expected, CK level is high, as is lactate.  CT scan of the head reveals a large subacute infarction involving the left middle and posterior cerebral arterial distributions. This is associated with a large amount of swelling, compression of the left frontal lobe, replacement of the left ventricle, about 1 cm of midline shift. There is also a small amount bleeding within the infarct area.  Patient's GCS is not improved. The patient's daughter is at bedside. I discussed the case with neurology. We appreciate their recommendations. There are 2 options at this time, which neurology as discussed in great length with the patient's daughter. One option is that we place a central venous catheter, administer hypertonic fluids; however, it is very clear that Mr. Ivanova will not return to his previous functional status. The other option is palliative care. I spent a large time at the patient's bedside, and family conference room discussing the options with the patient's daughter. She states that, per the patient's wishes expressed to her in  the past, she will choose palliative care for Mr.  Marveen Reeks. I believe that this respect the patient's wishes been clearly conveyed to her in the past. I have contacted the hospitalist service and we will admit Mr. Bischoff with plan for comfort care.    Although technically he is not alert, therefore next is criteria cannot be filled, I do not believe a cervical collar is consistent with the goal of comfort care. I have removed the patient's cervical collar and set him up in bed.  Patient is being admitted in stable condition.   I have discussed case and care has been guided by my attending physician, Dr. Audie Pinto.   Doy Hutching, MD 08/27/13 [phone]

## 2013-08-26 NOTE — ED Notes (Signed)
Pt cleansed and changed

## 2013-08-26 NOTE — ED Notes (Signed)
Called lab for blood cultures

## 2013-08-26 NOTE — ED Notes (Signed)
IM finished at [facility], speaking with daughter and family.

## 2013-08-26 NOTE — ED Notes (Signed)
Chem-8 and CG-4 reported to Dr. Audie Pinto

## 2013-08-26 NOTE — ED Notes (Addendum)
Dr. Jodi Mourning speaking with daughter/family re: options and plan of care, daughter family verbalize being agreeable to "palliative comfort care" and "non-aggressive measures", pending consults.

## 2013-08-26 NOTE — ED Notes (Addendum)
Dr. Tobias Alexander (neuro) at [facility] speaking with daughter about options and plan of care.  No changes with pt, VSS, calm, NAD, left-ward gaze, neglecting R, MAEx4 to physical stimulus, will re-correct awkward positions, does not follow commands, non-verbal, "guppy breathing", resps easy, relaxed and regular, no dyspnea.

## 2013-08-26 NOTE — ED Notes (Signed)
Dr. Katherine Roan with neuro to the bedside

## 2013-08-26 NOTE — Progress Notes (Signed)
Chaplain Note: Visited with patient's daughter and 2 friends in [facility] [place]. Provided ministry of presence and emotional support. Dorris Fetch, Chaplain

## 2013-08-26 NOTE — ED Notes (Signed)
Blood cultures obtained times 2

## 2013-08-26 NOTE — Progress Notes (Signed)
ANTIBIOTIC CONSULT NOTE - INITIAL  Pharmacy Consult for Vancomycin, Zosyn Indication: Sepsis   No Known Allergies  Patient Measurements:   Adjusted Body Weight: n/a  Vital Signs: Temp: 98.8 F (37.1 C) (05/01 1630) Temp src: Rectal (05/01 1630) BP: 147/74 mmHg (05/01 1845) Pulse Rate: 61 (05/01 1845) Intake/Output from previous day:   Intake/Output from this shift:    Labs:  Recent Labs  09/03/2013 1650 09/07/2013 1706  WBC  --  14.1*  HGB 16.0 14.3  PLT  --  130*  CREATININE 1.30 1.10   The CrCl is unknown because both a height and weight (above a minimum accepted value) are required for this calculation. No results found for this basename: VANCOTROUGH, VANCOPEAK, VANCORANDOM, GENTTROUGH, GENTPEAK, GENTRANDOM, TOBRATROUGH, TOBRAPEAK, TOBRARND, AMIKACINPEAK, AMIKACINTROU, AMIKACIN,  in the last 72 hours   Microbiology: No results found for this or any previous visit (from the past 720 hour(s)).  Medical History: Past Medical History  Diagnosis Date  . Coronary artery disease     cath ,cabg 11 at [facility]  . Hypertension   . Diabetes mellitus   . Middle ear disorder     problems surgery  . Arthritis   . Glaucoma   . Headache(784.0)     hx of problems-abscess dx in back  . 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 Bruises easily   . Memory loss     Medications:   (Not in a hospital admission) Assessment: 43 YOM presented to the ED from a nursing facility unresponsive. Pt was hemodynamically stable. Pharmacy consulted to start Zosyn and vancomycin for empiric therapy of sepsis. WBC elevated at 14.1, LA 4.24. Pt is afebrile. Patient received a dose of Vancomycin 1 gm IV and Zosyn 3.375 gm IV in the ED.   Cultures: 5/1 Blood Cx x2>>  5/1 Urine Cx >>   Goal of Therapy:  Vancomycin trough level 15-20 mcg/ml  Plan:  1) Start Vancomycin 750 mg IV q 12 hours  2) Start Zosyn 3.375 gm IV Q 8 hours  3) Monitor CBC, renal fx, cultures and patient's clinical  progress 4) Collect Vancomycin trough as indicated   Albertina Parr, PharmD.  Clinical Pharmacist Pager [phone]

## 2013-08-26 NOTE — ED Notes (Signed)
c-collar removed by Dr. Jodi Mourning. Pt given pillow. HOB raised to 30 degrees.

## 2013-08-26 NOTE — ED Notes (Signed)
Pt's watch given to daughter.

## 2013-08-27 DIAGNOSIS — M4802 Spinal stenosis, cervical region: Secondary | ICD-10-CM

## 2013-08-27 DIAGNOSIS — E119 Type 2 diabetes mellitus without complications: Secondary | ICD-10-CM

## 2013-08-27 DIAGNOSIS — I10 Essential (primary) hypertension: Secondary | ICD-10-CM

## 2013-08-27 DIAGNOSIS — M4712 Other spondylosis with myelopathy, cervical region: Secondary | ICD-10-CM

## 2013-08-27 LAB — URINE CULTURE
CULTURE: NO GROWTH
Colony Count: NO GROWTH

## 2013-08-27 LAB — GLUCOSE, CAPILLARY: GLUCOSE-CAPILLARY: 118 mg/dL — AB (ref 70–99)

## 2013-08-27 MED ORDER — ARTIFICIAL TEARS OP OINT
TOPICAL_OINTMENT | Freq: Three times a day (TID) | OPHTHALMIC | Status: DC
  Filled 2013-08-27: qty 3.5

## 2013-08-27 MED ORDER — ATROPINE SULFATE 1 % OP SOLN
4.0000 [drp] | OPHTHALMIC | Status: DC | PRN
  Administered 2013-08-28: 4 [drp] via SUBLINGUAL
  Filled 2013-08-27: qty 2

## 2013-08-27 MED ORDER — LORAZEPAM 2 MG/ML IJ SOLN
0.5000 mg | INTRAMUSCULAR | Status: DC | PRN
  Administered 2013-08-28: 0.5 mg via INTRAVENOUS
  Filled 2013-08-27 (×2): qty 1

## 2013-08-27 NOTE — Progress Notes (Signed)
Patient [id number] Purdy      DOB: 1928-04-24      VKF:[id number]  Goals of care set for between 430-5 pm today 5/2   Herschel Fleagle L. Lovena Le, MD MBA The Palliative Medicine Team at [facility] Phone: [phone] Pager: [phone]

## 2013-08-27 NOTE — Consult Note (Addendum)
Patient Sean Estrada      DOB: 15-Mar-1928      HQI:[id number]     Consult Note from the Palliative Medicine Team at [facility] Requested by: Dr. Wyline Copas    PCP: Adrian Prows, MD Reason for Consultation: Goals of care     Phone Number:[phone]  Assessment of patients Current state: Patient is an 80 year old white male who was found down at his home after an unknown period of time. Patient lives by himself but visited his wife daily and when he didn't call his daughter came to find him on the floor. Patient was found to have a large left middle cerebral subacute infarct in posterior cerebellar distribution infarction. There appeared to be a small amount of hemorrhage within the left middle cerebral artery distribution. I met with the patient's daughter. The patient's spouse is unable to participate in goals of care because of her on debility. The patient's daughter Sean Estrada had support from her significant other and a friend who is a Marine scientist. At this time goals of care are to support at a comfort level of care with consideration for transition out of the hospital if he remains stable and his current state to position him closer to his daughter and wife in [place] area. The patient reports at baseline her father has issues with chronic leg restlessness and chronic back pain which need to be kept in mind if he is not able to report symptoms. Please see my note from date of service. Daughter is struggling slightly with inability to eat safely but in general has elected comfort measures in the form of discontinuation of antibiotic therapy treatment for pain and restlessness. Sean Estrada going to talk with her mother this evening to start preparing her for the level of illness and potential death of her spouse.  Goals of Care: 1.  Code Status: DO NOT RESUSCITATE  2. Scope of Treatment: Comfort measures. Discontinue antibiotic therapy. Decreased IV fluids. Provide pain medication and  medication for anxiety. Allow for aggressive mouth care.   4. Disposition: To be determined   3. Symptom Management:   1. Anxiety/Agitation: Ativan 0.5 every 4 hours when necessary 2. Pain: Morphine every 2 hours 2 mg when necessary for pain or dyspnea 3. Bowel Regimen: Monitor 4. Fever: Tylenol when necessary 5. Terminal Secretions: Atropine drops when necessary and cannot scopolamine when needed 6. Lacri-Lube to right eye as it does not close completely  4. Psychosocial: patient had been living independently in attempting to care for his spouse is debilitated and skilled facility. Patient's daughter reports an intimate but challenging relationship with her dad. She describes her family as unique.   5. Spiritual: chaplaincy services have been offered as needed and she's going to check with her mom regarding whether or not her priest should be requested as her father was Catholic but not religious.     Patient Documents Completed or Given: Document Given Completed  Advanced Directives Pkt    MOST    DNR    Gone from My Sight    Hard Choices      Brief HPI: Patient is an 80 year old white male with a known past medical history for diabetes and hypertension patient was found down at his home diagnosed with a large stroke. We were asked to assist with goals of care   ROS: Patient unable to participate in review of systems secondary to stroke    PMH:  Past Medical History  Diagnosis Date  .  Coronary artery disease     cath ,cabg 11 at [facility]  . Hypertension   . Diabetes mellitus   . Middle ear disorder     problems surgery  . Arthritis   . Glaucoma   . Headache(784.0)     hx of problems-abscess dx in back  . 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 Bruises easily   . Memory loss      PSH: Past Surgical History  Procedure Laterality Date  . Cardiac catheterization    . Middle ear surgery  69    nerve damaged, osteomylitis hx, deaf rt  . Tongue surgery  96     partial removal ca  . Back surgery      lower abscess  . Tonsillectomy    . Coronary artery bypass graft      2011  . Joint replacement  02    rt knee02, lft ,hip revision 87hip 84  . Osteomylitis      surgeries arms, ear back  . Posterior cervical fusion/foraminotomy  11/12/2011    Procedure: POSTERIOR CERVICAL FUSION/FORAMINOTOMY LEVEL 5;  Surgeon: Ophelia Charter, MD;  Location: [facility];  Service: Neurosurgery;  Laterality: N/A;  Cervical decompression two - seven with seven thoracic one posterior cervical fusion with instrumentation  . Pacemaker insertion     I have reviewed the [place] and SH and  If appropriate update it with new information. No Known Allergies Scheduled Meds: . artificial tears   Right Eye 3 times per day  . latanoprost  1 drop Right Eye q morning - 10a  . timolol  1 drop Both Eyes QHS   Continuous Infusions: . sodium chloride 50 mL/hr at 08/27/13 1746   PRN Meds:.morphine injection    BP 145/72  Pulse 67  Temp(Src) 98.8 F (37.1 C) (Axillary)  Resp 18  Ht 5' 10.08" (1.78 m)  Wt 87 kg (191 lb 12.8 oz)  BMI 27.46 kg/m2  SpO2 98%   PPS: 10-20%   Intake/Output Summary (Last 24 hours) at 08/27/13 1836 Last data filed at 08/27/13 1358  Gross per 24 hour  Intake 813.33 ml  Output   1550 ml  Net -736.67 ml    Physical Exam:  General: Mild distress, eyes open with slightly labored breathing and a look of fear, slight abrasion fascial bruising around the right eye, right eye does not close completely  HEENT:  Pupils are equal round and reactive there is a slight gaze preference to the left Chest:  Decreased with some rhonchi at bases CVS: Irregular positive S1 and S2 no S3-S4 don't appreciate a murmur rub or gallop Abdomen: Soft nontender nondistended with positive bowel sounds Ext: multiple areas of bruising not voluntarily moving, except for right arm which he will extend and left arm and leg will withdrawal Neuro: A phasic, withdraws left  arm and leg to noxious stimuli and extension of right arm leftward gaze preference mild facial droop  Labs: CBC    Component Value Date/Time   WBC 14.1* 09/03/2013 1706   RBC 4.90 09/25/2013 1706   HGB 14.3 09/05/2013 1706   HCT 44.4 09/22/2013 1706   PLT 130* 09/20/2013 1706   MCV 90.6 09/12/2013 1706   MCH 29.2 09/01/2013 1706   MCHC 32.2 09/20/2013 1706   RDW 15.1 09/11/2013 1706   LYMPHSABS 0.7 09/08/2013 1706   MONOABS 1.0 09/08/2013 1706   EOSABS 0.0 09/16/2013 1706   BASOSABS 0.0 09/13/2013 1706  CMP     Component Value Date/Time   NA 147 09/08/2013 1706   K 4.0 09/03/2013 1706   CL 107 09/18/2013 1706   CO2 19 08/29/2013 1706   GLUCOSE 149* 09/23/2013 1706   BUN 47* 09/22/2013 1706   CREATININE 1.10 09/13/2013 1706   CALCIUM 9.7 08/27/2013 1706   PROT 7.4 09/14/2013 1706   ALBUMIN 3.8 09/22/2013 1706   AST 429* 08/29/2013 1706   ALT 339* 09/19/2013 1706   ALKPHOS 81 09/23/2013 1706   BILITOT 1.2 09/12/2013 1706   GFRNONAA 60* 09/14/2013 1706   GFRAA 70* 09/05/2013 1706    Chest Xray Reviewed/Impressions: Stable cardiac enlargement and mild vascular congestion.  Mild elevation of the right hemidiaphragm with overlying vascular  crowding and small effusion   CT scan of the Head Reviewed/Impressions: 1. . Large subacute infarction involving the left middle and posterior  cerebral artery distributions with areas of cortical sparing with  diffuse cortical thickening in that region. There is also diffuse  cortical thickening in the compressed left frontal lobe and 9 mm of  shift of midline structures from right to left. Further evaluation  with pre and postcontrast magnetic resonance imaging of the brain is  recommended to exclude underlying malignancy or metastatic disease.  2. Small amount of hemorrhage within the subacute infarct in the  left middle cerebral artery distribution.  3. Mild acute sphenoid, ethmoid and maxillary sinusitis.  4. Right facial and scalp soft tissue swelling without fracture.  5.  Cervical spine postsurgical and degenerative changes without  acute fracture or subluxation.  6. Bilateral carotid artery atheromatous calcifications.  7. Biapical bullous changes and interstitial pulmonary edema.  8. Right pleural effusion.     Time In Time Out Total Time Spent with Patient Total Overall Time  500 pm/710am  636 pm/730 am 118 min 96 min    Greater than 50%  of this time was spent counseling and coordinating care related to the above assessment and plan.   Arthi Mcdonald L. Lovena Le, MD MBA The Palliative Medicine Team at [facility] Phone: [phone] Pager: [phone]

## 2013-08-27 NOTE — Progress Notes (Signed)
[facility] PROGRESS NOTE  Munir Victorian YSA:[id number] DOB: Jan 23, 1928 DOA: 09/23/2013 PCP: Adrian Prows, MD  Assessment/Plan: 1. CVA 1. Overnight events reviewed. Pt not wanting aggressive measures. 2. DNR with comfort measures 3. Palliative Care had been requested - would appreciate recs 4. Neurology following 2. Elevated CK 1. Was started on IVF on admit 3. DM 1. Hold finger sticks secondary to comfort care status 4. HTN 1. Stable bp this AM 2. Comfort measures  Code Status: DNR - Comfort Family Communication: Pt in room Disposition Plan: Pending   Consultants:  Neurology  Palliative Care  HPI/Subjective: No acute events noted  Objective: Filed Vitals:   08/31/2013 2230 08/29/2013 2245 09/21/2013 2330 08/27/13 0525  BP: 138/61 147/65 155/66 143/64  Pulse:  58 60 60  Temp:   97.5 F (36.4 C) 97.5 F (36.4 C)  TempSrc:   Axillary Axillary  Resp: 20 20 16 20   Height:      Weight:   87 kg (191 lb 12.8 oz)   SpO2:  96% 99% 100%    Intake/Output Summary (Last 24 hours) at 08/27/13 0908 Last data filed at 08/27/13 0600  Gross per 24 hour  Intake 1813.33 ml  Output    900 ml  Net 913.33 ml   Filed Weights   09/17/2013 1900 08/30/2013 2330  Weight: 95.3 kg (210 lb 1.6 oz) 87 kg (191 lb 12.8 oz)    Exam:   General:  Awake, in nad  Cardiovascular: regular, s1, s2  Respiratory: normal resp effort, no wheezing  Abdomen: soft,nondistended  Musculoskeletal: perfused, no clubbing   Data Reviewed: Basic Metabolic Panel:  Recent Labs Lab 09/22/2013 1650 09/25/2013 1706  NA 147 147  K 4.5 4.0  CL 109 107  CO2  --  19  GLUCOSE 150* 149*  BUN 52* 47*  CREATININE 1.30 1.10  CALCIUM  --  9.7   Liver Function Tests:  Recent Labs Lab 08/30/2013 1706  AST 429*  ALT 339*  ALKPHOS 81  BILITOT 1.2  PROT 7.4  ALBUMIN 3.8   No results found for this basename: LIPASE, AMYLASE,  in the last 168 hours  Recent Labs Lab 08/27/2013 1706  AMMONIA 15    CBC:  Recent Labs Lab 09/09/2013 1650 09/19/2013 1706  WBC  --  14.1*  NEUTROABS  --  12.4*  HGB 16.0 14.3  HCT 47.0 44.4  MCV  --  90.6  PLT  --  130*   Cardiac Enzymes:  Recent Labs Lab 09/15/2013 1706  CKTOTAL 4005*   BNP (last 3 results) No results found for this basename: PROBNP,  in the last 8760 hours CBG:  Recent Labs Lab 08/27/13 0759  GLUCAP 118*    No results found for this or any previous visit (from the past 240 hour(s)).   Studies: Ct Head Wo Contrast  09/19/2013   CLINICAL DATA:  Altered mental status. Found on the floor. Right facial swelling. History of tongue cancer.  EXAM: CT HEAD WITHOUT CONTRAST  CT MAXILLOFACIAL WITHOUT CONTRAST  CT CERVICAL SPINE WITHOUT CONTRAST  TECHNIQUE: Multidetector CT imaging of the head, cervical spine, and maxillofacial structures were performed using the standard protocol without intravenous contrast. Multiplanar CT image reconstructions of the cervical spine and maxillofacial structures were also generated.  COMPARISON:  Cervical spine CT dated 03/01/2012.  FINDINGS: CT HEAD FINDINGS  Large area of low density involving the gray and white matter in left middle and posterior cerebral artery distributions. Associated areas of thickened cortex that area  spared. There is also diffuse cortical thickening in the left frontal lobe with loss of the cortical sulci and compression of the left lateral ventricle. There is 9 mm of midline shift to the right. No evidence of transtentorial herniation. Small areas of hemorrhage within the area of infarction in the left middle cerebral artery distribution. Right scalp soft tissue swelling. No fractures. Minimal fluid in the sphenoid sinus on the left. There is also fluid in the right maxillary sinus.  CT MAXILLOFACIAL FINDINGS  Small amount of fluid in the left sphenoid and left posterior ethmoid sinuses. Fluid in the right maxillary sinus with minimal mucosal thickening. Minimal left maxillary sinus  mucosal thickening. No fractures. Right facial subcutaneous edema and swelling.  CT CERVICAL SPINE FINDINGS  Posterior laminectomy defects at multiple levels. Posterior fixation screws at the C7-T1 level with stable mild anterolisthesis at that level. The lower fixation screw on the right continues to cross the lateral aspect of the spinal canal at the T1 level. Mild reversal of the normal cervical lordosis. Extensive degenerative changes at multiple levels. Bilateral carotid artery calcifications. Prominent interstitial markings and bullous changes of both lung apices with pleural fluid on the right. Unremarkable thyroid gland.  IMPRESSION: 1. Large subacute infarction involving the left middle and posterior cerebral artery distributions with areas of cortical sparing with diffuse cortical thickening in that region. There is also diffuse cortical thickening in the compressed left frontal lobe and 9 mm of shift of midline structures from right to left. Further evaluation with pre and postcontrast magnetic resonance imaging of the brain is recommended to exclude underlying malignancy or metastatic disease. 2. Small amount of hemorrhage within the subacute infarct in the left middle cerebral artery distribution. 3. Mild acute sphenoid, ethmoid and maxillary sinusitis. 4. Right facial and scalp soft tissue swelling without fracture. 5. Cervical spine postsurgical and degenerative changes without acute fracture or subluxation. 6. Bilateral carotid artery atheromatous calcifications. 7. Biapical bullous changes and interstitial pulmonary edema. 8. Right pleural effusion. These results were called by telephone at the time of interpretation on 09/01/2013 at 6:21 PM to Dr. Doy Hutching , who verbally acknowledged these results.   Electronically Signed   By: Enrique Sack M.D.   On: 09/25/2013 18:25   Ct Cervical Spine Wo Contrast  09/02/2013   CLINICAL DATA:  Altered mental status. Found on the floor. Right facial  swelling. History of tongue cancer.  EXAM: CT HEAD WITHOUT CONTRAST  CT MAXILLOFACIAL WITHOUT CONTRAST  CT CERVICAL SPINE WITHOUT CONTRAST  TECHNIQUE: Multidetector CT imaging of the head, cervical spine, and maxillofacial structures were performed using the standard protocol without intravenous contrast. Multiplanar CT image reconstructions of the cervical spine and maxillofacial structures were also generated.  COMPARISON:  Cervical spine CT dated 03/01/2012.  FINDINGS: CT HEAD FINDINGS  Large area of low density involving the gray and white matter in left middle and posterior cerebral artery distributions. Associated areas of thickened cortex that area spared. There is also diffuse cortical thickening in the left frontal lobe with loss of the cortical sulci and compression of the left lateral ventricle. There is 9 mm of midline shift to the right. No evidence of transtentorial herniation. Small areas of hemorrhage within the area of infarction in the left middle cerebral artery distribution. Right scalp soft tissue swelling. No fractures. Minimal fluid in the sphenoid sinus on the left. There is also fluid in the right maxillary sinus.  CT MAXILLOFACIAL FINDINGS  Small amount of fluid in the left  sphenoid and left posterior ethmoid sinuses. Fluid in the right maxillary sinus with minimal mucosal thickening. Minimal left maxillary sinus mucosal thickening. No fractures. Right facial subcutaneous edema and swelling.  CT CERVICAL SPINE FINDINGS  Posterior laminectomy defects at multiple levels. Posterior fixation screws at the C7-T1 level with stable mild anterolisthesis at that level. The lower fixation screw on the right continues to cross the lateral aspect of the spinal canal at the T1 level. Mild reversal of the normal cervical lordosis. Extensive degenerative changes at multiple levels. Bilateral carotid artery calcifications. Prominent interstitial markings and bullous changes of both lung apices with pleural  fluid on the right. Unremarkable thyroid gland.  IMPRESSION: 1. Large subacute infarction involving the left middle and posterior cerebral artery distributions with areas of cortical sparing with diffuse cortical thickening in that region. There is also diffuse cortical thickening in the compressed left frontal lobe and 9 mm of shift of midline structures from right to left. Further evaluation with pre and postcontrast magnetic resonance imaging of the brain is recommended to exclude underlying malignancy or metastatic disease. 2. Small amount of hemorrhage within the subacute infarct in the left middle cerebral artery distribution. 3. Mild acute sphenoid, ethmoid and maxillary sinusitis. 4. Right facial and scalp soft tissue swelling without fracture. 5. Cervical spine postsurgical and degenerative changes without acute fracture or subluxation. 6. Bilateral carotid artery atheromatous calcifications. 7. Biapical bullous changes and interstitial pulmonary edema. 8. Right pleural effusion. These results were called by telephone at the time of interpretation on 2013/09/02 at 6:21 PM to Dr. Doy Hutching , who verbally acknowledged these results.   Electronically Signed   By: Enrique Sack M.D.   On: 2013/09/02 18:25   Dg Pelvis Portable  09/02/2013   CLINICAL DATA:  Found down.  EXAM: PORTABLE PELVIS 1-2 VIEWS  COMPARISON:  None.  FINDINGS: There is a left hip prosthesis with multiple fractured cerclage wires near the greater trochanter. The right hip is intact. The pubic symphysis and SI joints are intact. No definite pelvic fractures.  IMPRESSION: No acute bony findings.   Electronically Signed   By: Kalman Jewels M.D.   On: 2013-09-02 17:42   Dg Chest Portable 1 View  Sep 02, 2013   CLINICAL DATA:  Found down.  Altered mental status.  EXAM: PORTABLE CHEST - 1 VIEW  COMPARISON:  08/11/2013.  FINDINGS: The heart is enlarged but stable. Stable surgical changes from bypass surgery and stable right ventricular pacer  wire. There is tortuosity, ectasia and calcification of the thoracic aorta. The lungs demonstrate mild vascular congestion. There is mild elevation right hemidiaphragm with overlying atelectasis and probable small effusion. No pneumothorax and no obvious rib fracture.  IMPRESSION: Stable cardiac enlargement and mild vascular congestion.  Mild elevation of the right hemidiaphragm with overlying vascular crowding and small effusion.   Electronically Signed   By: Kalman Jewels M.D.   On: 02-Sep-2013 17:30   Ct Maxillofacial Wo Cm  02-Sep-2013   CLINICAL DATA:  Altered mental status. Found on the floor. Right facial swelling. History of tongue cancer.  EXAM: CT HEAD WITHOUT CONTRAST  CT MAXILLOFACIAL WITHOUT CONTRAST  CT CERVICAL SPINE WITHOUT CONTRAST  TECHNIQUE: Multidetector CT imaging of the head, cervical spine, and maxillofacial structures were performed using the standard protocol without intravenous contrast. Multiplanar CT image reconstructions of the cervical spine and maxillofacial structures were also generated.  COMPARISON:  Cervical spine CT dated 03/01/2012.  FINDINGS: CT HEAD FINDINGS  Large area of low density involving the  gray and white matter in left middle and posterior cerebral artery distributions. Associated areas of thickened cortex that area spared. There is also diffuse cortical thickening in the left frontal lobe with loss of the cortical sulci and compression of the left lateral ventricle. There is 9 mm of midline shift to the right. No evidence of transtentorial herniation. Small areas of hemorrhage within the area of infarction in the left middle cerebral artery distribution. Right scalp soft tissue swelling. No fractures. Minimal fluid in the sphenoid sinus on the left. There is also fluid in the right maxillary sinus.  CT MAXILLOFACIAL FINDINGS  Small amount of fluid in the left sphenoid and left posterior ethmoid sinuses. Fluid in the right maxillary sinus with minimal mucosal  thickening. Minimal left maxillary sinus mucosal thickening. No fractures. Right facial subcutaneous edema and swelling.  CT CERVICAL SPINE FINDINGS  Posterior laminectomy defects at multiple levels. Posterior fixation screws at the C7-T1 level with stable mild anterolisthesis at that level. The lower fixation screw on the right continues to cross the lateral aspect of the spinal canal at the T1 level. Mild reversal of the normal cervical lordosis. Extensive degenerative changes at multiple levels. Bilateral carotid artery calcifications. Prominent interstitial markings and bullous changes of both lung apices with pleural fluid on the right. Unremarkable thyroid gland.  IMPRESSION: 1. Large subacute infarction involving the left middle and posterior cerebral artery distributions with areas of cortical sparing with diffuse cortical thickening in that region. There is also diffuse cortical thickening in the compressed left frontal lobe and 9 mm of shift of midline structures from right to left. Further evaluation with pre and postcontrast magnetic resonance imaging of the brain is recommended to exclude underlying malignancy or metastatic disease. 2. Small amount of hemorrhage within the subacute infarct in the left middle cerebral artery distribution. 3. Mild acute sphenoid, ethmoid and maxillary sinusitis. 4. Right facial and scalp soft tissue swelling without fracture. 5. Cervical spine postsurgical and degenerative changes without acute fracture or subluxation. 6. Bilateral carotid artery atheromatous calcifications. 7. Biapical bullous changes and interstitial pulmonary edema. 8. Right pleural effusion. These results were called by telephone at the time of interpretation on 09/25/2013 at 6:21 PM to Dr. Doy Hutching , who verbally acknowledged these results.   Electronically Signed   By: Enrique Sack M.D.   On: 09/04/2013 18:25    Scheduled Meds: . clonazePAM  0.5 mg Oral QHS  . hydrALAZINE  50 mg Oral BID   . latanoprost  1 drop Right Eye q morning - 10a  . metFORMIN  500 mg Oral BID WC  . simvastatin  40 mg Oral QHS  . timolol  1 drop Both Eyes QHS   Continuous Infusions: . sodium chloride 50 mL/hr at 09/14/2013 2344    Principal Problem:   CVA (cerebral infarction) Active Problems:   Diabetes mellitus, type II   Hypertension  Time spent: 57min  Brandyn Lowrey K Eren Ryser  [facility] Pager [phone]. If 7PM-7AM, please contact night-coverage at [email or web address], password [facility] 08/27/2013, 9:08 AM  LOS: 1 day

## 2013-08-27 NOTE — Progress Notes (Signed)
PT Cancellation Note  Patient Details Name: Sean Estrada MRN: [id number] DOB: 02/09/1928   Cancelled Treatment:    Reason Eval/Treat Not Completed: Medical issues which prohibited therapy.  Patient is not following commands and is comfort care only.  Palliative Care consult with [place] meeting this pm.  Will await GOC to determine appropriateness of PT evaluation.   Despina Pole 08/27/2013, 3:06 PM Carita Pian. Sanjuana Kava, [place] Pager [phone]

## 2013-08-27 NOTE — Consult Note (Signed)
Patient Sean Estrada      DOB: [birth year]      DGR:[id number]  Summary of Goals of care; full note to follow:  Met with daughter Sean Estrada who has POA and is acting in steed for her mother who is 78 yrs old with her own health problems  Sean Estrada was accompanied by her friends Sean Estrada and Sean Estrada who are helping her with some of these choices  Currently Sean Estrada would like comfort care- addressing pain, anxiety , his restless legs .  She is going to engage her mother in a conversation about the extent of his stroke and potential for loss of life.  She is aware he is not able to eat or drink and for now we will keep low dose IV fluids while Sean Estrada works through this process.  No further antibiotics are ok with her if they are not in place at present.     Recommend: 1.  DNR comfort care  2.  Aggressive mouth care, may dab coffee on tongue as this in pleasurable for him prior to his injury  3.  Can use atropine and scopolamine if he starts to get wet. And decrease IV fluids if excess fluid retention.  4.  Dressing changes to hip and elbow  5. Lacrilube for right eye , not closing fully  Total time : 500  Pm to 645 pm   Sean Estrada L. Lovena Le, MD MBA The Palliative Medicine Team at [facility] Phone: [phone] Pager: [phone]

## 2013-08-27 NOTE — Evaluation (Signed)
Clinical/Bedside Swallow Evaluation Patient Details  Name: Sean Estrada MRN: [id number] Date of Birth: 1928-02-10  Today's Date: 08/27/2013 Time: 1100-1120 SLP Time Calculation (min): 20 min  Past Medical History:  Past Medical History  Diagnosis Date  . Coronary artery disease     cath ,cabg 11 at [facility]  . Hypertension   . Diabetes mellitus   . Middle ear disorder     problems surgery  . Arthritis   . Glaucoma   . Headache(784.0)     hx of problems-abscess dx in back  . 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 Bruises easily   . Memory loss    Past Surgical History:  Past Surgical History  Procedure Laterality Date  . Cardiac catheterization    . Middle ear surgery  69    nerve damaged, osteomylitis hx, deaf rt  . Tongue surgery  96    partial removal ca  . Back surgery      lower abscess  . Tonsillectomy    . Coronary artery bypass graft      2011  . Joint replacement  02    rt knee02, lft ,hip revision 87hip 84  . Osteomylitis      surgeries arms, ear back  . Posterior cervical fusion/foraminotomy  11/12/2011    Procedure: POSTERIOR CERVICAL FUSION/FORAMINOTOMY LEVEL 5;  Surgeon: Ophelia Charter, MD;  Location: [place] [facility];  Service: Neurosurgery;  Laterality: N/A;  Cervical decompression two - seven with seven thoracic one posterior cervical fusion with instrumentation  . Pacemaker insertion     HPI:  Sean Estrada is a 80 y.o. male with a history of Diabetes hypertension presents to the ED for a stroke. Patient was last seen by his family on Wednesday this week. His wife who is a resident of [place] had noted that he had not visited her. After this friends tried to call him. Today he was found in his home down on the ground not responsive. The last time he was seen apparently he was in his normal state. Patient did have some forgetfulness as a baseline but his daughter states that he did not have dementia as such.   Assessment / Plan /  Recommendation Clinical Impression  Pt did not trigger a swallow with ice chips- several attempts. He did close mouth around oral swab and swallowed liquid from that and cleared throat. Pt is not ready for PO intake at this time. ST will continue to follow for oral intake readiness but recommend that pt remain NPO at this time, meds via alternative means. Until then, patient needs oral care at least 4x/ day.  May reattempt ice chips following thorough oral care but monitor for whether or not pt is able to trigger a swallow.    Aspiration Risk  Severe    Diet Recommendation NPO   Medication Administration: Via alternative means    Other  Recommendations Oral Care Recommendations: Oral care Q4 per protocol Other Recommendations: Have oral suction available   Follow Up Recommendations       Frequency and Duration min 2x/week  2 weeks   Pertinent Vitals/Pain n/a    SLP Swallow Goals     Swallow Study Prior Functional Status       General HPI: Sean Estrada is a 80 y.o. male with a history of Diabetes hypertension presents to the ED for a stroke. Patient was last seen by his family on Wednesday this week. His wife who is a  resident of [place] had noted that he had not visited her. After this friends tried to call him. Today he was found in his home down on the ground not responsive. The last time he was seen apparently he was in his normal state. Patient did have some forgetfulness as a baseline but his daughter states that he did not have dementia as such. Type of Study: Bedside swallow evaluation Diet Prior to this Study: NPO Temperature Spikes Noted: No Respiratory Status: Nasal cannula History of Recent Intubation: No Behavior/Cognition: Doesn't follow directions;Decreased sustained attention Oral Cavity - Dentition: Missing dentition (no teeth on top) Patient Positioning: Upright in bed Volitional Cough: Cognitively unable to elicit Volitional Swallow: Unable to elicit     Oral/Motor/Sensory Function Overall Oral Motor/Sensory Function: Other (comment) (Pt unable to follow directions during oral motor eval) Labial Strength: Within Functional Limits   Ice Chips Ice chips: Impaired Presentation: Spoon Oral Phase Impairments: Poor awareness of bolus;Reduced lingual movement/coordination;Impaired anterior to posterior transit;Other (comment) (no swallow trigger or lingual movement with ice chip)   Thin Liquid Thin Liquid: Not tested    Nectar Thick Nectar Thick Liquid: Not tested   Honey Thick Honey Thick Liquid: Not tested   Puree Puree: Not tested   Solid   GO    Solid: Not tested       Amy Dionicia Abler, MA, CCC-SLP 08/27/2013,11:37 AM

## 2013-08-27 NOTE — Progress Notes (Addendum)
Comfort care measures only. Please call if needed.  Mikey Bussing PA-C [facility] Pager [phone] 08/27/2013, 7:56 AM

## 2013-08-27 NOTE — ED Provider Notes (Signed)
I saw and evaluated the patient, reviewed the resident's note and I agree with the findings and plan.   .Face to face Exam:  General:  Lethargic and obtunded HEENT:  Atraumatic Resp:  Course Abd:  Nondistended Neuro:GCS =4  CRITICAL CARE Performed by: Dot Lanes Total critical care time: 30 min Critical care time was exclusive of separately billable procedures and treating other patients. Critical care was necessary to treat or prevent imminent or life-threatening deterioration. Critical care was time spent personally by me on the following activities: development of treatment plan with patient and/or surrogate as well as nursing, discussions with consultants, evaluation of patient's response to treatment, examination of patient, obtaining history from patient or surrogate, ordering and performing treatments and interventions, ordering and review of laboratory studies, ordering and review of radiographic studies, pulse oximetry and re-evaluation of patient's condition.    Dot Lanes, MD 08/27/13 1105

## 2013-08-28 DIAGNOSIS — Z515 Encounter for palliative care: Secondary | ICD-10-CM

## 2013-08-28 MED ORDER — SCOPOLAMINE 1 MG/3DAYS TD PT72
1.0000 | MEDICATED_PATCH | TRANSDERMAL | Status: DC
  Administered 2013-08-28: 1.5 mg via TRANSDERMAL
  Filled 2013-08-28: qty 1

## 2013-08-28 MED ORDER — LORAZEPAM 2 MG/ML IJ SOLN
0.5000 mg | Freq: Once | INTRAMUSCULAR | Status: AC
  Administered 2013-08-28: 0.5 mg via INTRAVENOUS

## 2013-08-28 MED ORDER — MORPHINE SULFATE 2 MG/ML IJ SOLN
2.0000 mg | Freq: Once | INTRAMUSCULAR | Status: AC
  Administered 2013-08-28: 2 mg via INTRAVENOUS

## 2013-08-29 LAB — OCCULT BLOOD, POC DEVICE: Fecal Occult Bld: NEGATIVE

## 2013-09-02 LAB — CULTURE, BLOOD (ROUTINE X 2)
Culture: NO GROWTH
Culture: NO GROWTH

## 2013-09-26 NOTE — Progress Notes (Signed)
Notified [employer], spoke with [employer], pt. Is not a potential donor.  Referral number in chart. Syliva Overman

## 2013-09-26 NOTE — Discharge Summary (Addendum)
Death Summary  [name] QIH:[id number] DOB: November 17, 1927 DOA: 2013-09-24  PCP: Adrian Prows, MD  Admit date: 09/24/2013 Date of Death: 09/26/2013  Final Diagnoses:  Principal Problem:   CVA (cerebral infarction) Active Problems:   Diabetes mellitus, type II   Hypertension Stage 2 CKD Cerebral edema with midline shift  History of present illness:  Sean Estrada is a 80 y.o. male with a history of Diabetes hypertension presents to the ED for a stroke. Patient was last seen by his family on Wednesday this week. His wife who is a resident of [place] had noted that he had not visited her. After this friends tried to call him. Today he was found in his home down on the ground not responsive. The last time he was seen apparently he was in his normal state. Patient did have some forgetfulness as a baseline but his daughter states that he did not have dementia as such.  Hospital Course:  The patient was admitted to the inpatient service. After a lengthy discussion with the family, it was felt by the family that the patient's wishes were for full comfort care. Palliative care was consulted for assistance. The patient remained comfortable and stable overnight. At 1110 on Sep 27, 2022, the patient was noted to have no spontaneous respirations or heart sounds. The patient was subsequently pronounced at this time. Family remained at bedside   Time: 1110  Signed:  [facility] 09/26/2013, 11:36 AM

## 2013-09-26 NOTE — Progress Notes (Signed)
PT Cancellation/Discharge Note  Patient Details Name: Rook Maue MRN: [id number] DOB: 04-Jan-1928   Cancelled Treatment:    Reason Eval/Treat Not Completed: Medical issues which prohibited therapy.  Per Palliative Care note, patient comfort care only.  Patient not following commands per SLP.  PT will sign off at this time.  Thank you.   Despina Pole 09/06/2013, 7:30 AM Carita Pian. Sanjuana Kava, [place] Pager [phone]

## 2013-09-26 NOTE — Progress Notes (Signed)
Patient Sean Estrada      DOB: Nov 24, 1927      EZM:[id number]   Palliative Medicine Team at [facility] Progress Note    Subjective: Called earlier by nursing regarding change in breathing pattern. Adjusted medications and asked that his daughter be updated on change. Subsequently titrated meds with nurse for increasing agitation and increasing respiratory distress. Arrived to find daughter at the bedside. Patient ashen in less distress but obviously actively dying, nail beds cyanotic, stare. Took daughter out of room briefly to explain this to her. Returned her as quickly as I could.  As we returned to the bedside he took his last breath. Time of death 4 am. Called chaplian at daughters request and provided emotional support. Advised Dr. Wyline Copas.  Filed Vitals:   09-16-2013 0645  BP: 143/68  Pulse: 72  Temp: 98.1 F (36.7 C)  Resp: 24   Physical exam:  Cyanotic nail beds, pallor,  Shallow respirations followed by no breath sounds and no heart beat.   Assessment and plan: 80 yr old male found down with new stroke.  Patient developed worsened respiratory distress this am.  Daughter was called.  He died with [place] at his side.   Time: [place] Lovena Le, MD MBA The Palliative Medicine Team at [facility] Phone: [phone] Pager: [phone]

## 2013-09-26 NOTE — Progress Notes (Signed)
Contacted Medical Examiner, Vicente Males, who said the pt. Would not be considered a medical examiner's case and that the attending physician could sign the death certificate. Syliva Overman

## 2013-09-26 NOTE — Progress Notes (Signed)
Chaplain responded to request from 6N nurse to support daughter of pt who just passed. Pt's daughter was very open to chaplain support. Pt had lived at [facility] in [place] and his wife is in nursing care there. Pt's daughter is an only child and felt very close to her dad. She expressed great satisfaction that she was able to be here when he breathed his last. We had prayer together. I departed since she was in a good mental and emotional place and was expecting the soon arrival of a close friend.

## 2013-09-26 NOTE — Progress Notes (Signed)
Met with Dr. Lovena Le outside pt.'s room.  Pt. Expired on 09-26-13 at 1110 am.  Pt. Not breathing, no pulse.  Pt.'s daughter Manuela Schwartz in room with pt. At time of death.  Attending physician notified.  Attending physician will sign death certificate. Syliva Overman

## 2013-09-26 DEATH — deceased

## 2013-10-14 ENCOUNTER — Ambulatory Visit: Payer: Medicare Other | Admitting: Podiatry

## 2014-08-19 NOTE — Op Note (Signed)
PATIENT NAME:  Sean Estrada, Sean Estrada MR#:  [id number] DATE OF BIRTH:  [birth year]  DATE OF PROCEDURE:  08/11/2013  PRIMARY CARE PHYSICIAN: Cheral Marker. Ola Spurr, MD  PREPROCEDURAL DIAGNOSIS: Sick sinus syndrome.   PROCEDURE: Single-chamber pacemaker implantation.   POSTPROCEDURAL DIAGNOSIS: Ventricular pacing.   INDICATION: The patient is an 81 year old gentleman with known history of coronary artery disease. The patient has been experiencing worsening exertional shortness of breath and fatigue. Holter monitor revealed marked bradycardia with rates in the high 30s and atrial fibrillation and junctional rhythm.   DESCRIPTION OF PROCEDURE: The risks, benefits and alternatives of permanent pacemaker implantation were explained to the patient, and informed written consent was obtained. He was brought to the operating room in a fasting state. The left pectoral region was prepped and draped in the usual sterile manner. Anesthesia was obtained with 1% Xylocaine locally. A 6 cm incision was performed over the left pectoral region. The pacemaker pocket was generated by electrocautery and blunt dissection. Access was obtained to the left subclavian vein by fine needle aspiration. A ventricular lead (Medtronic [id number]) was positioned in the right ventricular apical septum. After proper thresholds were obtained, the lead was sutured in place. The lead was connected to a single-chamber rate responsive pacemaker generator (Medtronic [id number]). The pacemaker pocket was irrigated with gentamicin solution. The pacemaker generator was positioned in the pocket. The pocket was closed with 2-0 and 4-0 Vicryl, respectively. Steri-Strips and a pressure dressing were applied.   ____________________________ Isaias Cowman, MD ap:lb D: 08/11/2013 13:38:51 ET T: 08/11/2013 13:54:01 ET JOB#: [id number]  cc: Isaias Cowman, MD, <Dictator> Isaias Cowman MD ELECTRONICALLY SIGNED 08/30/2013 12:46

## 2015-01-05 IMAGING — CT CT HEAD W/O CM
2 of 11 series · 8 of 47 positions shown, 10 images · non-contrast
Comparison: Cervical spine CT dated 03/01/2012.

CLINICAL DATA: Altered mental status. Found on the floor. Right
facial swelling. History of tongue cancer.

EXAM:
CT HEAD WITHOUT CONTRAST
CT MAXILLOFACIAL WITHOUT CONTRAST
CT CERVICAL SPINE WITHOUT CONTRAST
TECHNIQUE: Multidetector CT imaging of the head, cervical spine, and
maxillofacial structures were performed using the standard protocol
without intravenous contrast. Multiplanar CT image reconstructions
of the cervical spine and maxillofacial structures were also
generated.

[Series 308: coronals · coronal · 0.41mm/px · 2 of 66 slices shown]
[im 22/66  brain]
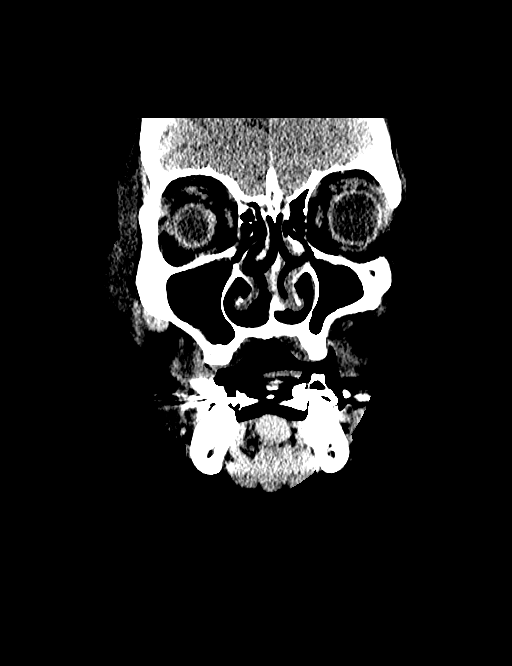
[im 44/66  brain]
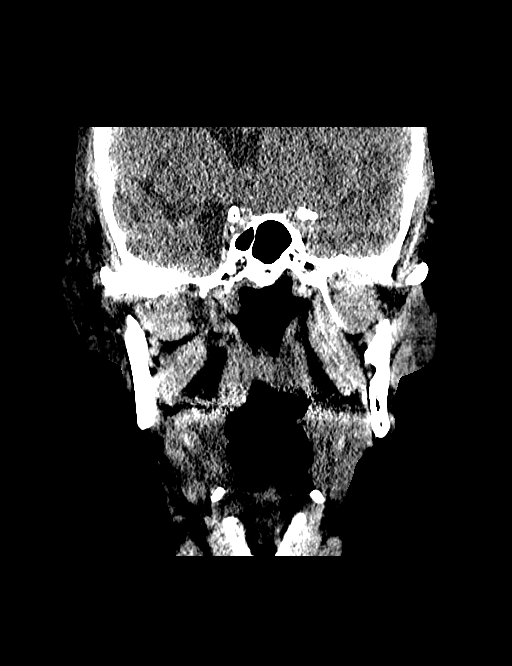

[Series 407: orthogs · axial · 0.31mm/px · z∈[+80,+215]mm · 6 of 101 slices shown, 8 images]
[im 15/101  brain]
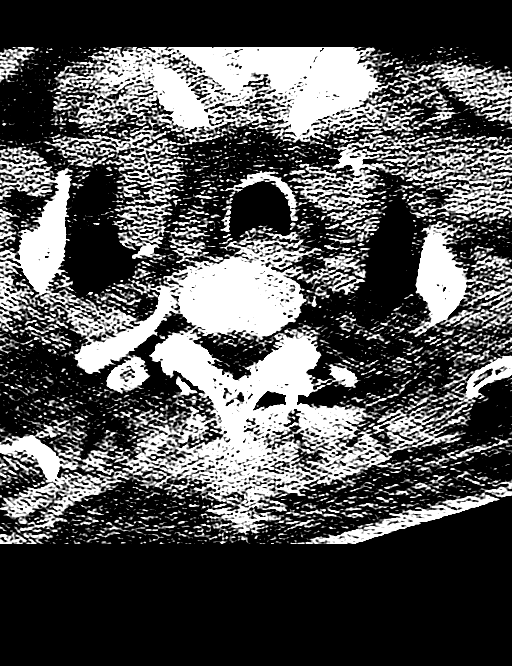
[im 15/101  bone]
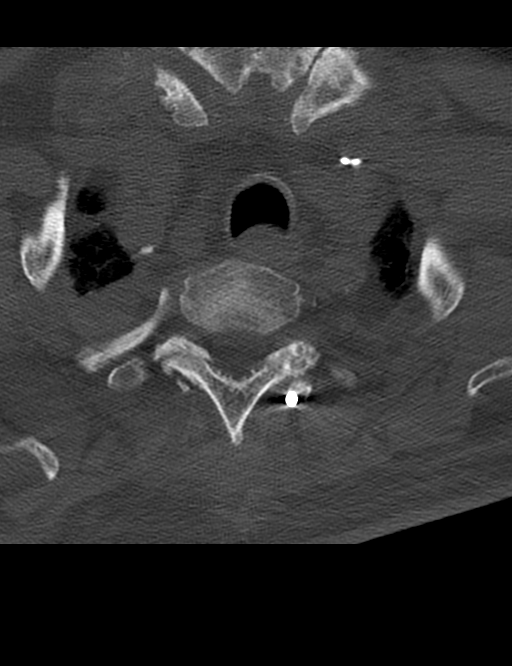
[im 29/101  brain]
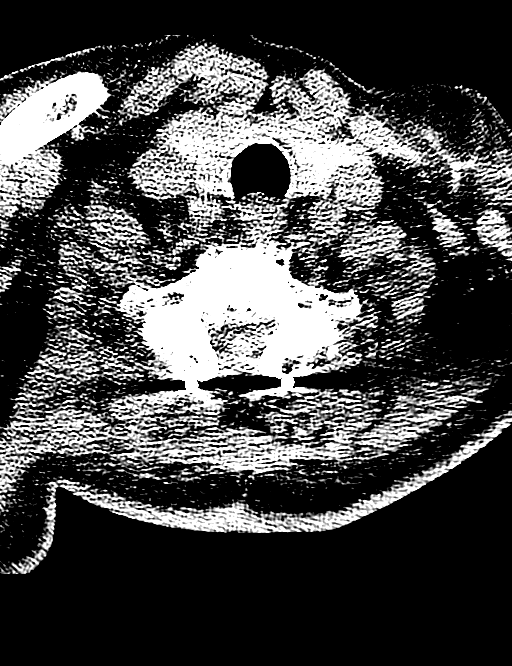
[im 43/101  brain]
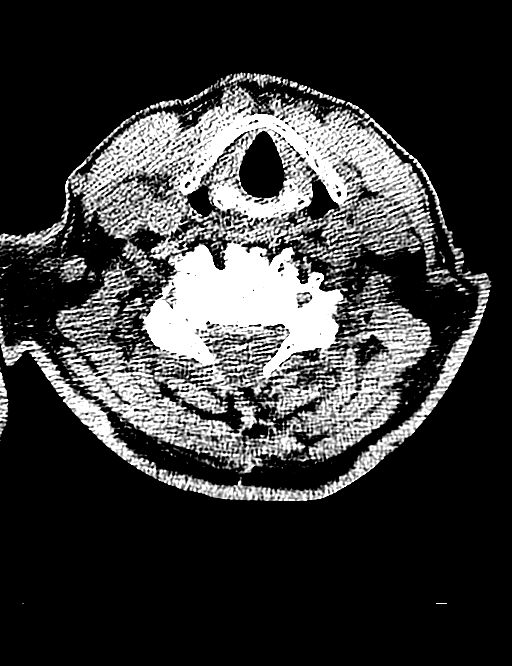
[im 58/101  brain]
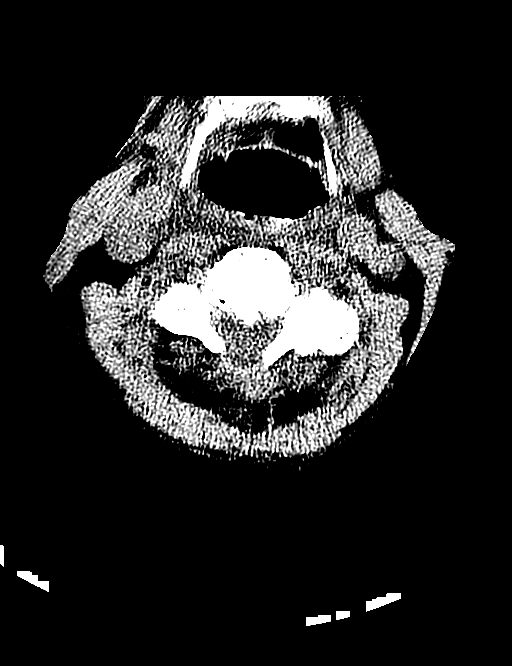
[im 72/101  brain]
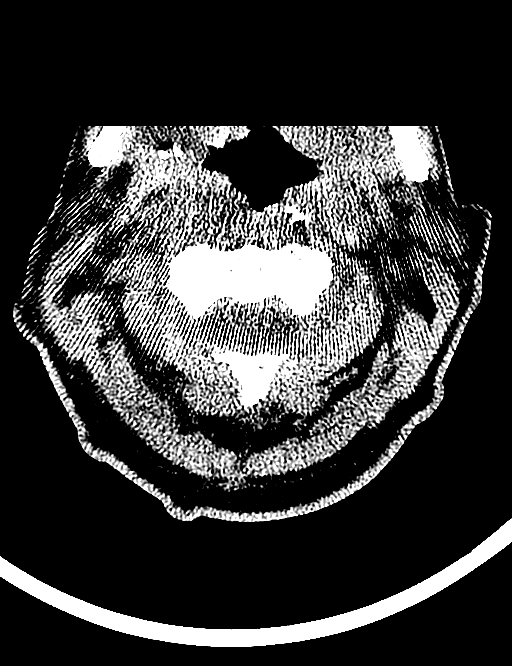
[im 72/101  bone]
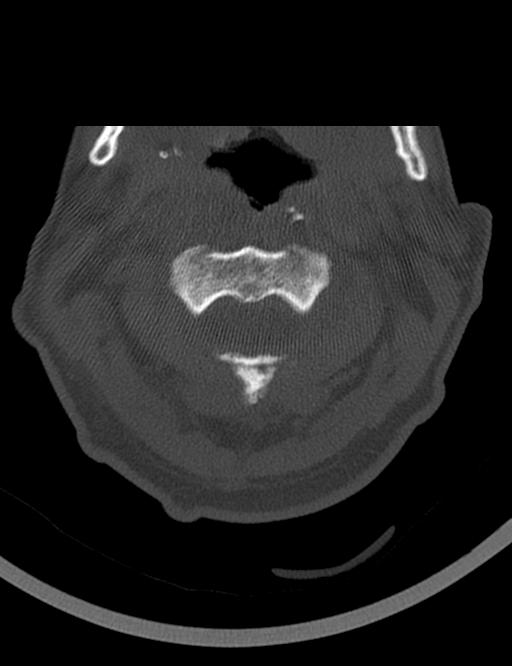
[im 86/101  brain]
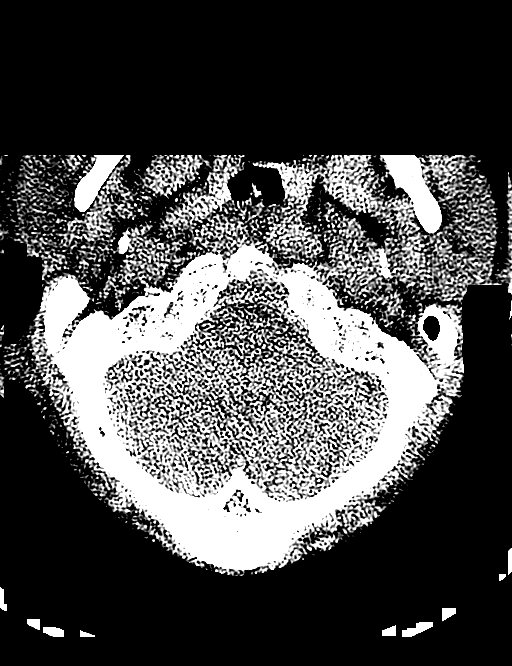

[8 of 47 positions shown; findings below may reference images not displayed]

FINDINGS: CT HEAD FINDINGS

Large area of low density involving the gray and white matter in
left middle and posterior cerebral artery distributions. Associated
areas of thickened cortex that area spared. There is also diffuse
cortical thickening in the left frontal lobe with loss of the
cortical sulci and compression of the left lateral ventricle. There
is 9 mm of midline shift to the right. No evidence of transtentorial
herniation. Small areas of hemorrhage within the area of infarction
in the left middle cerebral artery distribution. Right scalp soft
tissue swelling. No fractures. Minimal fluid in the sphenoid sinus
on the left. There is also fluid in the right maxillary sinus.

CT MAXILLOFACIAL FINDINGS

Small amount of fluid in the left sphenoid and left posterior
ethmoid sinuses. Fluid in the right maxillary sinus with minimal
mucosal thickening. Minimal left maxillary sinus mucosal thickening.
No fractures. Right facial subcutaneous edema and swelling.

CT CERVICAL SPINE FINDINGS

Posterior laminectomy defects at multiple levels. Posterior fixation
screws at the C7-T1 level with stable mild anterolisthesis at that
level. The lower fixation screw on the right continues to cross the
lateral aspect of the spinal canal at the T1 level. Mild reversal of
the normal cervical lordosis. Extensive degenerative changes at
multiple levels. Bilateral carotid artery calcifications. Prominent
interstitial markings and bullous changes of both lung apices with
pleural fluid on the right. Unremarkable thyroid gland.
IMPRESSION: 1. Large subacute infarction involving the left middle and posterior
cerebral artery distributions with areas of cortical sparing with
diffuse cortical thickening in that region. There is also diffuse
cortical thickening in the compressed left frontal lobe and 9 mm of
shift of midline structures from right to left. Further evaluation
with pre and postcontrast magnetic resonance imaging of the brain is
recommended to exclude underlying malignancy or metastatic disease.
2. Small amount of hemorrhage within the subacute infarct in the
left middle cerebral artery distribution.
3. Mild acute sphenoid, ethmoid and maxillary sinusitis.
4. Right facial and scalp soft tissue swelling without fracture.
5. Cervical spine postsurgical and degenerative changes without
acute fracture or subluxation.
6. Bilateral carotid artery atheromatous calcifications.
7. Biapical bullous changes and interstitial pulmonary edema.
8. Right pleural effusion.
These results were called by telephone at the time of interpretation
on 08/26/2013 at [DATE] to Dr. DIOMEDES DEKKER , who verbally
acknowledged these results.
